# Patient Record
Sex: Female | Born: 1970
Health system: Southern US, Community
[De-identification: ages and names within clinical notes are randomized; demographics above are authoritative.]

## PROBLEM LIST (undated history)

## (undated) DIAGNOSIS — R519 Headache, unspecified: Secondary | ICD-10-CM

## (undated) DIAGNOSIS — M1990 Unspecified osteoarthritis, unspecified site: Secondary | ICD-10-CM

## (undated) DIAGNOSIS — E039 Hypothyroidism, unspecified: Secondary | ICD-10-CM

## (undated) DIAGNOSIS — T8859XA Other complications of anesthesia, initial encounter: Secondary | ICD-10-CM

## (undated) DIAGNOSIS — I10 Essential (primary) hypertension: Secondary | ICD-10-CM

## (undated) DIAGNOSIS — T4145XA Adverse effect of unspecified anesthetic, initial encounter: Secondary | ICD-10-CM

## (undated) HISTORY — PX: KNEE ARTHROSCOPY: SUR90

## (undated) HISTORY — DX: Essential (primary) hypertension: I10

## (undated) HISTORY — DX: Unspecified osteoarthritis, unspecified site: M19.90

## (undated) HISTORY — PX: TONSILECTOMY, ADENOIDECTOMY, BILATERAL MYRINGOTOMY AND TUBES: SHX2538

---

## 1999-08-03 ENCOUNTER — Other Ambulatory Visit: Admission: RE | Admit: 1999-08-03 | Discharge: 1999-08-03 | Payer: Self-pay | Admitting: Obstetrics and Gynecology

## 2000-08-14 ENCOUNTER — Other Ambulatory Visit: Admission: RE | Admit: 2000-08-14 | Discharge: 2000-08-14 | Payer: Self-pay | Admitting: Obstetrics and Gynecology

## 2001-09-30 ENCOUNTER — Other Ambulatory Visit: Admission: RE | Admit: 2001-09-30 | Discharge: 2001-09-30 | Payer: Self-pay | Admitting: Obstetrics and Gynecology

## 2002-11-27 ENCOUNTER — Other Ambulatory Visit: Admission: RE | Admit: 2002-11-27 | Discharge: 2002-11-27 | Payer: Self-pay | Admitting: Obstetrics and Gynecology

## 2004-06-29 ENCOUNTER — Encounter (INDEPENDENT_AMBULATORY_CARE_PROVIDER_SITE_OTHER): Payer: Self-pay | Admitting: *Deleted

## 2004-06-29 ENCOUNTER — Ambulatory Visit (HOSPITAL_COMMUNITY): Admission: RE | Admit: 2004-06-29 | Discharge: 2004-06-30 | Payer: Self-pay | Admitting: Otolaryngology

## 2004-09-12 ENCOUNTER — Other Ambulatory Visit: Admission: RE | Admit: 2004-09-12 | Discharge: 2004-09-12 | Payer: Self-pay | Admitting: Obstetrics and Gynecology

## 2007-09-25 ENCOUNTER — Encounter: Admission: RE | Admit: 2007-09-25 | Discharge: 2007-09-25 | Payer: Self-pay | Admitting: Obstetrics & Gynecology

## 2007-12-22 ENCOUNTER — Inpatient Hospital Stay (HOSPITAL_COMMUNITY): Admission: RE | Admit: 2007-12-22 | Discharge: 2007-12-26 | Payer: Self-pay | Admitting: Obstetrics & Gynecology

## 2007-12-27 ENCOUNTER — Encounter: Admission: RE | Admit: 2007-12-27 | Discharge: 2008-01-22 | Payer: Self-pay | Admitting: Obstetrics & Gynecology

## 2008-03-08 ENCOUNTER — Ambulatory Visit (HOSPITAL_COMMUNITY): Admission: RE | Admit: 2008-03-08 | Discharge: 2008-03-08 | Payer: Self-pay | Admitting: Family Medicine

## 2008-04-06 ENCOUNTER — Other Ambulatory Visit: Admission: RE | Admit: 2008-04-06 | Discharge: 2008-04-06 | Payer: Self-pay | Admitting: Family Medicine

## 2008-04-06 ENCOUNTER — Encounter (INDEPENDENT_AMBULATORY_CARE_PROVIDER_SITE_OTHER): Payer: Self-pay | Admitting: Family Medicine

## 2011-03-20 NOTE — Discharge Summary (Signed)
Mariah Baker, Mariah Baker               ACCOUNT NO.:  [id number]   MEDICAL RECORD NO.:  [id number]          PATIENT TYPE:  INP   LOCATION:  9118                          FACILITY:  [facility]   PHYSICIAN:  Genia Del, M.D.DATE OF BIRTH:  06-04-1971   DATE OF ADMISSION:  12/22/2007  DATE OF DISCHARGE:  12/26/2007                               DISCHARGE SUMMARY   ADMISSION DIAGNOSES:  A 38 plus weeks' gestational diabetes mellitus A2,  transverse presentation, successful external cephalic version,  induction, failure to progress in active labor.   DISCHARGE DIAGNOSES:  A 38 plus weeks' gestational diabetes mellitus A2,  transverse presentation, successful external cephalic version,  induction, failure to progress in active labor, C-section delivery of a  healthy baby girl.   PROCEDURE:  Urgent low-transverse primary C-section on December 23, 2007.  No complications.   POSTOP COURSE:  The patient had an uneventful postop course.  She  remained afebrile and hemodynamically stable.  Her postop hemoglobin was  8.2 with a hematocrit at 24.3.  She was discharged home on postop day #3  in stable status.  Postop advice were given.  She will continue  metformin 750 twice a day.  She was put on Chromagen Forte 1 tablet  b.i.d.  She will follow up at [facility] in 6 weeks.      Genia Del, M.D.  Electronically Signed     ML/MEDQ  D:  01/22/2008  T:  01/23/2008  Job:  [id number]

## 2011-03-20 NOTE — Op Note (Signed)
NAMELONISHA, Mariah Baker               ACCOUNT NO.:  [id number]   MEDICAL RECORD NO.:  [id number]          PATIENT TYPE:  OBV   LOCATION:  9118                          FACILITY:  [facility]   PHYSICIAN:  Genia Del, M.D.DATE OF BIRTH:  1971-10-21   DATE OF PROCEDURE:  12/23/2007  DATE OF DISCHARGE:                               OPERATIVE REPORT   PREOPERATIVE DIAGNOSES:  1. At 38+ weeks gestation.  2. Gestational diabetes mellitus A2.  3. Failure to progress.   POSTOPERATIVE DIAGNOSES:  1. At 38+ weeks gestation.  2. Gestational diabetes mellitus A2.  3. Failure to progress.   PROCEDURE:  Urgent low transverse primary C-section.   SURGEON:  Dr. Genia Del.   ASSISTANT:  None.   PROCEDURE:  Under epidural anesthesia, the patient is in 15 degree left  decubitus position.  She is prepped with Betadine on the abdominal,  suprapubic and vulvar areas and draped as usual.  Due to obesity, the  abdomen is taped to facilitate the surgery.  We verify the level of  anesthesia and it is adequate.  We infiltrate the subcutaneous tissue  with Marcaine 0.25 plain 20 mL at the site of the Pfannenstiel incision,  which we make with the scalpel.  We then open the adipose tissue with  the electrocautery, open the aponeurosis transversely with the  electrocautery and complete on each side with the Mayo scissors.  We  complete hemostasis where needed with the electrocautery.  We then  separate the abdominal muscles from the aponeurosis on the midline  superiorly and inferiorly.  We then open the parietal peritoneum  longitudinally on the midline with Mayo scissors.  We open the visceral  peritoneum transversely over the lower uterine segment with [facility]  scissors.  The bladder is reclined downward and the bladder retractor is  put in place.  We then make a low transverse hysterotomy with the  scalpel, extension on each side with dressing scissors.  The amniotic  fluid is clear.  The fetus is  in cephalic presentation, still high in  the pelvis.  We deliver a baby girl at 4:36, loose nuchal cord is  present.  The baby is suctioned.  The cord is clamped and cut and the  baby is given to the neonatal team.  Apgars are 9 and 10.  Weight is 8  pounds 10 ounces.  We then extract the placenta manually.  It is given  for cord blood banking and then sent to labor and delivery.  We do a  uterine revision.  Pitocin is started in the IV fluid and the uterus is  contracting well.  A dose of Ancef 2 grams IV is given.  Both tubes and  both ovaries are normal to inspection.  We then close the hysterotomy  with a first locked running suture of Vicryl zero.  We then complete  with a second layer of Vicryl zero in a mattress stitch.  Hemostasis is  completed with an X stitch in the middle of the incision with Vicryl  zero.  We had exteriorized the uterus, it is put back in  place at this  point.  Hemostasis is verified once more and it is adequate.  We  irrigate and suction the abdominopelvic cavities.  We then complete  hemostasis on the recti muscles with the electrocautery.  The  aponeurosis is closed with two half running sutures of Vicryl zero.  We  put plain zero separate stitches on the adipose tissue.  Hemostasis is  completed at that level with the electrocautery.  We then reapproximate  the skin with staples.  The count of instruments and sponges was  complete x2.  Dry dressing was applied.   ESTIMATED BLOOD LOSS:  700 mL.   COMPLICATIONS:  No complications occurred and the patient was brought to  the recovery room in good stable status.      Genia Del, M.D.  Electronically Signed     ML/MEDQ  D:  12/23/2007  T:  12/23/2007  Job:  [id number]

## 2011-04-12 ENCOUNTER — Other Ambulatory Visit: Payer: Self-pay | Admitting: Obstetrics & Gynecology

## 2011-07-27 LAB — CBC
HCT: 24.3 — ABNORMAL LOW
Hemoglobin: 11.6 — ABNORMAL LOW
MCHC: 33.8
MCV: 84.6
MCV: 85.4
Platelets: 172
RDW: 17 — ABNORMAL HIGH
RDW: 17.6 — ABNORMAL HIGH
WBC: 9.1

## 2011-07-27 LAB — RPR: RPR Ser Ql: NONREACTIVE

## 2011-12-12 ENCOUNTER — Ambulatory Visit (INDEPENDENT_AMBULATORY_CARE_PROVIDER_SITE_OTHER): Payer: BC Managed Care – PPO | Admitting: Family Medicine

## 2011-12-12 VITALS — BP 145/84 | HR 71 | Temp 97.7°F | Resp 16 | Ht 64.5 in | Wt 294.4 lb

## 2011-12-12 DIAGNOSIS — I10 Essential (primary) hypertension: Secondary | ICD-10-CM | POA: Insufficient documentation

## 2011-12-12 DIAGNOSIS — E119 Type 2 diabetes mellitus without complications: Secondary | ICD-10-CM | POA: Insufficient documentation

## 2011-12-12 DIAGNOSIS — H1030 Unspecified acute conjunctivitis, unspecified eye: Secondary | ICD-10-CM

## 2011-12-12 MED ORDER — OFLOXACIN 0.3 % OP SOLN: OPHTHALMIC | Status: DC

## 2011-12-12 NOTE — Patient Instructions (Addendum)
Patient was instructed to use the eyedrops frequently for 2 days then 4 times daily. She is to return or see an eye doctor if the eye is getting worse. Marland Kitchen

## 2011-12-12 NOTE — Progress Notes (Signed)
  Subjective:    Patient ID: Mariah Baker, female    DOB: [birth year], 41 y.o.   MRN: [id number]  HPI patient has a upper chart for infection which she got from her child. Today is day progressed she has gotten more redness inflammation and drainage of the left eye. She has had conjunctivitis in the past, but not a major or recurrent problems. No history of any foreign body in her eye.    Review of Systems Upper chart her congestion is noted. She is diabetic and has high blood pressure per    Objective:   Physical Exam TMs normal except for a little dry wax throat has erythema of the soft palate. Her neck was without significant nodes sinuses not particularly tender. The left eye is quite red weepy very inflamed conjunctiva       Assessment & Plan:  Acute conjunctivitis.  Will give her antibiotic eyedrops. If she gets worse she needs to see an eye doctor.

## 2012-05-05 ENCOUNTER — Ambulatory Visit (HOSPITAL_COMMUNITY): Admission: RE | Admit: 2012-05-05 | Payer: Self-pay | Source: Ambulatory Visit

## 2012-05-05 ENCOUNTER — Other Ambulatory Visit (HOSPITAL_COMMUNITY): Payer: Self-pay | Admitting: Family Medicine

## 2012-05-05 ENCOUNTER — Ambulatory Visit (HOSPITAL_COMMUNITY): Payer: Self-pay

## 2012-05-06 ENCOUNTER — Ambulatory Visit (HOSPITAL_COMMUNITY): Payer: Self-pay

## 2012-05-06 ENCOUNTER — Ambulatory Visit (HOSPITAL_COMMUNITY)
Admission: RE | Admit: 2012-05-06 | Discharge: 2012-05-06 | Disposition: A | Discharge status: Home / Self Care | Payer: BC Managed Care – PPO | Source: Ambulatory Visit | Attending: Family Medicine | Admitting: Family Medicine

## 2012-05-06 DIAGNOSIS — M7989 Other specified soft tissue disorders: Secondary | ICD-10-CM

## 2012-05-06 NOTE — Progress Notes (Signed)
Left lower extremity venous duplex completed.  Preliminary report is negative for DVT, SVT, or a Baker's cyst in the left leg.  Negative for DVT in the right common femoral vein. 

## 2012-07-22 ENCOUNTER — Other Ambulatory Visit (HOSPITAL_COMMUNITY): Payer: Self-pay | Admitting: Family Medicine

## 2012-07-22 DIAGNOSIS — R609 Edema, unspecified: Secondary | ICD-10-CM

## 2012-07-24 ENCOUNTER — Other Ambulatory Visit (HOSPITAL_COMMUNITY): Payer: BC Managed Care – PPO

## 2012-10-03 ENCOUNTER — Ambulatory Visit (HOSPITAL_COMMUNITY)
Admission: RE | Admit: 2012-10-03 | Discharge: 2012-10-03 | Disposition: A | Discharge status: Home / Self Care | Payer: BC Managed Care – PPO | Source: Ambulatory Visit | Attending: Family Medicine | Admitting: Family Medicine

## 2012-10-03 ENCOUNTER — Other Ambulatory Visit (HOSPITAL_COMMUNITY): Payer: Self-pay | Admitting: Family Medicine

## 2012-10-03 DIAGNOSIS — E119 Type 2 diabetes mellitus without complications: Secondary | ICD-10-CM

## 2012-10-03 DIAGNOSIS — M7730 Calcaneal spur, unspecified foot: Secondary | ICD-10-CM | POA: Insufficient documentation

## 2013-03-10 ENCOUNTER — Other Ambulatory Visit: Payer: Self-pay

## 2013-03-10 DIAGNOSIS — Z1231 Encounter for screening mammogram for malignant neoplasm of breast: Secondary | ICD-10-CM

## 2013-04-14 ENCOUNTER — Ambulatory Visit
Admission: RE | Admit: 2013-04-14 | Discharge: 2013-04-14 | Disposition: A | Discharge status: Home / Self Care | Payer: BC Managed Care – PPO | Source: Ambulatory Visit

## 2013-04-14 DIAGNOSIS — Z1231 Encounter for screening mammogram for malignant neoplasm of breast: Secondary | ICD-10-CM

## 2013-04-15 ENCOUNTER — Other Ambulatory Visit: Payer: Self-pay | Admitting: Obstetrics & Gynecology

## 2013-04-15 DIAGNOSIS — R928 Other abnormal and inconclusive findings on diagnostic imaging of breast: Secondary | ICD-10-CM

## 2013-04-29 ENCOUNTER — Other Ambulatory Visit: Payer: Self-pay | Admitting: Obstetrics & Gynecology

## 2013-04-29 ENCOUNTER — Ambulatory Visit
Admission: RE | Admit: 2013-04-29 | Discharge: 2013-04-29 | Disposition: A | Discharge status: Home / Self Care | Payer: BC Managed Care – PPO | Source: Ambulatory Visit | Attending: Obstetrics & Gynecology | Admitting: Obstetrics & Gynecology

## 2013-04-29 DIAGNOSIS — R928 Other abnormal and inconclusive findings on diagnostic imaging of breast: Secondary | ICD-10-CM

## 2013-05-06 ENCOUNTER — Other Ambulatory Visit: Payer: Self-pay | Admitting: Obstetrics & Gynecology

## 2013-05-06 ENCOUNTER — Ambulatory Visit
Admission: RE | Admit: 2013-05-06 | Discharge: 2013-05-06 | Disposition: A | Discharge status: Home / Self Care | Payer: BC Managed Care – PPO | Source: Ambulatory Visit | Attending: Obstetrics & Gynecology | Admitting: Obstetrics & Gynecology

## 2013-05-06 DIAGNOSIS — R928 Other abnormal and inconclusive findings on diagnostic imaging of breast: Secondary | ICD-10-CM

## 2013-09-21 ENCOUNTER — Other Ambulatory Visit: Payer: Self-pay | Admitting: Obstetrics & Gynecology

## 2013-09-21 DIAGNOSIS — N6310 Unspecified lump in the right breast, unspecified quadrant: Secondary | ICD-10-CM

## 2013-10-21 ENCOUNTER — Ambulatory Visit
Admission: RE | Admit: 2013-10-21 | Discharge: 2013-10-21 | Disposition: A | Discharge status: Home / Self Care | Payer: BC Managed Care – PPO | Source: Ambulatory Visit | Attending: Obstetrics & Gynecology | Admitting: Obstetrics & Gynecology

## 2013-10-21 DIAGNOSIS — N6310 Unspecified lump in the right breast, unspecified quadrant: Secondary | ICD-10-CM

## 2014-03-24 ENCOUNTER — Other Ambulatory Visit: Payer: Self-pay

## 2014-03-24 DIAGNOSIS — Z1231 Encounter for screening mammogram for malignant neoplasm of breast: Secondary | ICD-10-CM

## 2014-05-04 ENCOUNTER — Ambulatory Visit
Admission: RE | Admit: 2014-05-04 | Discharge: 2014-05-04 | Disposition: A | Discharge status: Home / Self Care | Payer: BC Managed Care – PPO | Source: Ambulatory Visit

## 2014-05-04 DIAGNOSIS — Z1231 Encounter for screening mammogram for malignant neoplasm of breast: Secondary | ICD-10-CM

## 2014-05-17 ENCOUNTER — Other Ambulatory Visit: Payer: Self-pay | Admitting: Specialist

## 2014-05-17 DIAGNOSIS — G932 Benign intracranial hypertension: Secondary | ICD-10-CM

## 2014-06-04 ENCOUNTER — Ambulatory Visit
Admission: RE | Admit: 2014-06-04 | Discharge: 2014-06-04 | Disposition: A | Discharge status: Home / Self Care | Payer: BC Managed Care – PPO | Source: Ambulatory Visit | Attending: Specialist | Admitting: Specialist

## 2014-06-04 VITALS — BP 94/56 | HR 67

## 2014-06-04 DIAGNOSIS — G932 Benign intracranial hypertension: Secondary | ICD-10-CM

## 2014-06-04 NOTE — Progress Notes (Signed)
Discharge instructions explained to patient, along with the "gray area" of performing an epidural blood patch on a patient with suspected pseudotumor cerebri.  Patient understands she may have a positional headache from the LP just because we altered her ICP from an opening pressure of 39 cm H2O to 16 cm H2O at closing and that her brain may take some time to adjust to its new "normal."  Mariah SievertJeanne Providencia Hottenstein, RN

## 2014-06-04 NOTE — Progress Notes (Signed)
One tiger-topped tube of blood drawn from right AC space without difficulty for LP labs; site unremarkable.  jkl 

## 2014-06-04 NOTE — Discharge Instructions (Signed)

## 2014-06-05 LAB — HERPES SIMPLEX VIRUS(HSV) DNA BY PCR
HSV 1 DNA: NOT DETECTED
HSV 2 DNA: NOT DETECTED

## 2014-06-05 LAB — CRYPTOCOCCAL ANTIGEN, CSF: CRYPTO AG: NEGATIVE

## 2014-06-08 LAB — CSF PANEL 1
Glucose, CSF: 59 mg/dL (ref 43–76)
RBC COUNT CSF: 30 uL — AB
SYPHILIS VDRL QUANT CSF: NONREACTIVE
TOTAL PROTEIN, CSF: 43 mg/dL (ref 15–45)
Tube #: 2
WBC CSF: 1 uL (ref 0–5)

## 2014-06-08 LAB — ANGIOTENSIN CONVERTING ENZYME, CSF: ACE, CSF: 9 U/L (ref <=15)

## 2014-06-21 LAB — B. BURGDORFI ANTIBODIES, CSF: LYME AB: NEGATIVE

## 2014-07-01 LAB — FUNGUS CULTURE W SMEAR: SMEAR RESULT: NONE SEEN

## 2014-08-20 ENCOUNTER — Other Ambulatory Visit: Payer: Self-pay

## 2014-08-27 ENCOUNTER — Ambulatory Visit (HOSPITAL_BASED_OUTPATIENT_CLINIC_OR_DEPARTMENT_OTHER): Payer: BC Managed Care – PPO | Attending: Specialist | Admitting: Radiology

## 2014-08-27 DIAGNOSIS — Z6841 Body Mass Index (BMI) 40.0 and over, adult: Secondary | ICD-10-CM | POA: Diagnosis not present

## 2014-08-27 DIAGNOSIS — G4730 Sleep apnea, unspecified: Secondary | ICD-10-CM | POA: Diagnosis present

## 2014-08-27 DIAGNOSIS — G4733 Obstructive sleep apnea (adult) (pediatric): Secondary | ICD-10-CM | POA: Insufficient documentation

## 2014-08-27 DIAGNOSIS — G4710 Hypersomnia, unspecified: Secondary | ICD-10-CM | POA: Diagnosis present

## 2014-08-27 DIAGNOSIS — R0683 Snoring: Secondary | ICD-10-CM

## 2014-08-29 DIAGNOSIS — R0683 Snoring: Secondary | ICD-10-CM

## 2014-08-29 NOTE — Sleep Study (Signed)
   NAME: Mariah Baker DATE OF BIRTH:  1971-07-27 MEDICAL RECORD NUMBER [id number]  LOCATION: [facility]  PHYSICIAN: Rainbow Salman D  DATE OF STUDY: 08/27/2014  SLEEP STUDY TYPE: Nocturnal Polysomnogram               REFERRING PHYSICIAN: Santiago GladFreeman, Marshall, MD  INDICATION FOR STUDY: Hypersomnia with sleep apnea  EPWORTH SLEEPINESS SCORE:  2/24 HEIGHT:   5'4"  WEIGHT:   263 lbs    BMI: 45 NECK SIZE: 15.5 in.  MEDICATIONS: Charted for review  SLEEP ARCHITECTURE: Total sleep time 390 min, Sleep efficiency 89.4%, Stage N1 11.5%, Stage N2 80.8%, Stage N3 absent, REM 7.7%, Sleep latency 5.5 min, REM latency 255.5 min, Awake after sleep onset 40.5 min, arousal index 57.7, bedtime medication None  RESPIRATORY DATA: Apnea/ Hypopnea Index (AHI) 81.5/ hr. 529 total events scored, including 248 obstructive apneas, 3 central apneas, 1 mixed apnea, 277 hypopneas. Events were not positional. REM AHI 24.0/ hr. This study was ordered as a diagnostic polysomnogram (NPSG) without CPAP.  OXYGEN DATA: Very loud snoring, with oxygen desaturation to a nadir of 76% and mean oxygen saturation of 93% on room air  CARDIAC DATA: Normal sinus rhythm  MOVEMENT/PARASOMNIA: No significant movement disturbance, no bathroom trips  IMPRESSION/ RECOMMENDATION:   1) Severe obstructive sleep apnea/ hypopnea syndrome, AHI 81.5/ hr. Nonpositional. REM AHI 24.0/ hr. Very loud snoring with oxygen desaturation to a nadir of 76% and mean oxygen saturation 93% on room air.  This study was ordered as a diagnostic NPSG without CPAP titration. The patient can return for a CPAP titration study if appropriate, and consultation for management is available if desired.   Waymon BudgeYOUNG,Javid Kemler D Diplomate, American Board of Sleep Medicine  ELECTRONICALLY SIGNED ON:  08/29/2014, 1:20 PM  [facility] PH: [phone]   FX: [phone] ACCREDITED BY THE AMERICAN ACADEMY OF SLEEP  MEDICINE

## 2015-05-02 ENCOUNTER — Other Ambulatory Visit: Payer: Self-pay

## 2015-05-31 ENCOUNTER — Other Ambulatory Visit: Payer: Self-pay

## 2015-05-31 DIAGNOSIS — Z1231 Encounter for screening mammogram for malignant neoplasm of breast: Secondary | ICD-10-CM

## 2015-07-07 ENCOUNTER — Ambulatory Visit
Admission: RE | Admit: 2015-07-07 | Discharge: 2015-07-07 | Disposition: A | Discharge status: Home / Self Care | Payer: BLUE CROSS/BLUE SHIELD | Source: Ambulatory Visit

## 2015-07-07 DIAGNOSIS — Z1231 Encounter for screening mammogram for malignant neoplasm of breast: Secondary | ICD-10-CM

## 2016-03-13 DIAGNOSIS — Z0000 Encounter for general adult medical examination without abnormal findings: Secondary | ICD-10-CM | POA: Diagnosis not present

## 2016-03-13 DIAGNOSIS — Z1389 Encounter for screening for other disorder: Secondary | ICD-10-CM | POA: Diagnosis not present

## 2016-03-13 DIAGNOSIS — Z6841 Body Mass Index (BMI) 40.0 and over, adult: Secondary | ICD-10-CM | POA: Diagnosis not present

## 2016-03-13 DIAGNOSIS — E119 Type 2 diabetes mellitus without complications: Secondary | ICD-10-CM | POA: Diagnosis not present

## 2016-06-08 DIAGNOSIS — G932 Benign intracranial hypertension: Secondary | ICD-10-CM | POA: Diagnosis not present

## 2016-06-08 DIAGNOSIS — R51 Headache: Secondary | ICD-10-CM | POA: Diagnosis not present

## 2016-07-19 ENCOUNTER — Other Ambulatory Visit: Payer: Self-pay | Admitting: Obstetrics & Gynecology

## 2016-07-19 DIAGNOSIS — Z1231 Encounter for screening mammogram for malignant neoplasm of breast: Secondary | ICD-10-CM

## 2016-08-06 ENCOUNTER — Ambulatory Visit
Admission: RE | Admit: 2016-08-06 | Discharge: 2016-08-06 | Disposition: A | Discharge status: Home / Self Care | Payer: BLUE CROSS/BLUE SHIELD | Source: Ambulatory Visit | Attending: Obstetrics & Gynecology | Admitting: Obstetrics & Gynecology

## 2016-08-06 DIAGNOSIS — Z1231 Encounter for screening mammogram for malignant neoplasm of breast: Secondary | ICD-10-CM

## 2016-09-05 DIAGNOSIS — Z6841 Body Mass Index (BMI) 40.0 and over, adult: Secondary | ICD-10-CM | POA: Diagnosis not present

## 2016-09-05 DIAGNOSIS — Z01419 Encounter for gynecological examination (general) (routine) without abnormal findings: Secondary | ICD-10-CM | POA: Diagnosis not present

## 2016-11-26 DIAGNOSIS — R946 Abnormal results of thyroid function studies: Secondary | ICD-10-CM | POA: Diagnosis not present

## 2016-11-26 DIAGNOSIS — E782 Mixed hyperlipidemia: Secondary | ICD-10-CM | POA: Diagnosis not present

## 2016-11-26 DIAGNOSIS — Z23 Encounter for immunization: Secondary | ICD-10-CM | POA: Diagnosis not present

## 2016-11-26 DIAGNOSIS — Z6841 Body Mass Index (BMI) 40.0 and over, adult: Secondary | ICD-10-CM | POA: Diagnosis not present

## 2016-11-26 DIAGNOSIS — E119 Type 2 diabetes mellitus without complications: Secondary | ICD-10-CM | POA: Diagnosis not present

## 2016-11-26 DIAGNOSIS — Z1389 Encounter for screening for other disorder: Secondary | ICD-10-CM | POA: Diagnosis not present

## 2016-12-11 DIAGNOSIS — E119 Type 2 diabetes mellitus without complications: Secondary | ICD-10-CM | POA: Diagnosis not present

## 2016-12-11 DIAGNOSIS — H5213 Myopia, bilateral: Secondary | ICD-10-CM | POA: Diagnosis not present

## 2016-12-11 DIAGNOSIS — H524 Presbyopia: Secondary | ICD-10-CM | POA: Diagnosis not present

## 2016-12-11 DIAGNOSIS — H52203 Unspecified astigmatism, bilateral: Secondary | ICD-10-CM | POA: Diagnosis not present

## 2017-01-08 DIAGNOSIS — E039 Hypothyroidism, unspecified: Secondary | ICD-10-CM | POA: Diagnosis not present

## 2017-01-08 DIAGNOSIS — Z1389 Encounter for screening for other disorder: Secondary | ICD-10-CM | POA: Diagnosis not present

## 2017-01-08 DIAGNOSIS — Z6841 Body Mass Index (BMI) 40.0 and over, adult: Secondary | ICD-10-CM | POA: Diagnosis not present

## 2017-03-15 DIAGNOSIS — E782 Mixed hyperlipidemia: Secondary | ICD-10-CM | POA: Diagnosis not present

## 2017-03-15 DIAGNOSIS — Z1389 Encounter for screening for other disorder: Secondary | ICD-10-CM | POA: Diagnosis not present

## 2017-03-15 DIAGNOSIS — E039 Hypothyroidism, unspecified: Secondary | ICD-10-CM | POA: Diagnosis not present

## 2017-03-15 DIAGNOSIS — Z6841 Body Mass Index (BMI) 40.0 and over, adult: Secondary | ICD-10-CM | POA: Diagnosis not present

## 2017-03-15 DIAGNOSIS — E119 Type 2 diabetes mellitus without complications: Secondary | ICD-10-CM | POA: Diagnosis not present

## 2017-08-28 ENCOUNTER — Other Ambulatory Visit: Payer: Self-pay | Admitting: Obstetrics & Gynecology

## 2017-08-28 DIAGNOSIS — Z1231 Encounter for screening mammogram for malignant neoplasm of breast: Secondary | ICD-10-CM

## 2017-09-19 ENCOUNTER — Telehealth: Payer: Self-pay | Admitting: *Deleted

## 2017-09-19 ENCOUNTER — Ambulatory Visit
Admission: RE | Admit: 2017-09-19 | Discharge: 2017-09-19 | Disposition: A | Discharge status: Home / Self Care | Payer: BLUE CROSS/BLUE SHIELD | Source: Ambulatory Visit | Attending: Obstetrics & Gynecology | Admitting: Obstetrics & Gynecology

## 2017-09-19 DIAGNOSIS — Z1231 Encounter for screening mammogram for malignant neoplasm of breast: Secondary | ICD-10-CM | POA: Diagnosis not present

## 2017-09-19 NOTE — Telephone Encounter (Signed)
Pt called requesting birth control pill refill, paper [facility] from [facility] to send chart and Rx can be refilled.

## 2017-09-23 ENCOUNTER — Other Ambulatory Visit: Payer: Self-pay | Admitting: Obstetrics & Gynecology

## 2017-09-23 NOTE — Telephone Encounter (Signed)
Patient has annual scheduled on 11/13/17, Rx sent for Camila, chart arrived. Rx was approved in refill request.

## 2017-10-02 DIAGNOSIS — E782 Mixed hyperlipidemia: Secondary | ICD-10-CM | POA: Diagnosis not present

## 2017-10-02 DIAGNOSIS — Z6841 Body Mass Index (BMI) 40.0 and over, adult: Secondary | ICD-10-CM | POA: Diagnosis not present

## 2017-10-02 DIAGNOSIS — Z1389 Encounter for screening for other disorder: Secondary | ICD-10-CM | POA: Diagnosis not present

## 2017-10-02 DIAGNOSIS — I10 Essential (primary) hypertension: Secondary | ICD-10-CM | POA: Diagnosis not present

## 2017-10-02 DIAGNOSIS — E119 Type 2 diabetes mellitus without complications: Secondary | ICD-10-CM | POA: Diagnosis not present

## 2017-10-02 DIAGNOSIS — E039 Hypothyroidism, unspecified: Secondary | ICD-10-CM | POA: Diagnosis not present

## 2017-10-02 DIAGNOSIS — Z23 Encounter for immunization: Secondary | ICD-10-CM | POA: Diagnosis not present

## 2017-11-13 ENCOUNTER — Encounter: Payer: Self-pay | Admitting: Obstetrics & Gynecology

## 2017-11-13 ENCOUNTER — Ambulatory Visit (INDEPENDENT_AMBULATORY_CARE_PROVIDER_SITE_OTHER): Payer: BLUE CROSS/BLUE SHIELD | Admitting: Obstetrics & Gynecology

## 2017-11-13 VITALS — BP 136/84 | Ht 64.0 in | Wt 290.0 lb

## 2017-11-13 DIAGNOSIS — Z6841 Body Mass Index (BMI) 40.0 and over, adult: Secondary | ICD-10-CM

## 2017-11-13 DIAGNOSIS — Z01419 Encounter for gynecological examination (general) (routine) without abnormal findings: Secondary | ICD-10-CM

## 2017-11-13 DIAGNOSIS — Z3041 Encounter for surveillance of contraceptive pills: Secondary | ICD-10-CM

## 2017-11-13 MED ORDER — NORETHINDRONE 0.35 MG PO TABS: ORAL_TABLET | ORAL | 4 refills | Status: DC

## 2017-11-13 NOTE — Patient Instructions (Signed)
1. Encounter for routine gynecological examination with Papanicolaou smear of cervix Normal gynecologic exam.  Pap reflex done.  Breast exam normal.  Recent screening mammogram in November 2000 18 negative.  Diabetes mellitus type 2 and hypothyroidism followed by endocrinologist.  Chronic hypertension well controlled on losartan.  2. Encounter for surveillance of contraceptive pills Doing well on progestin only pill.  No contraindication.  Represcribed.  3. Class 3 severe obesity due to excess calories with serious comorbidity and body mass index (BMI) of 45.0 to 49.9 in adult [facility]) Importance of weight loss discussed.  Recommend low calorie/low carb diet such as [employer].  Regular physical activity at least 5 times a week of aerobic and weightlifting every 2 days recommended.  Other orders - norethindrone (MICRONOR,CAMILA,ERRIN) 0.35 MG tablet; TAKE ONE (1) TABLET BY MOUTH EVERY DAY   Mariah Baker, it was a pleasure seeing you today!  I will inform you of your results as soon as they are available.   Exercising to Lose Weight Exercising can help you to lose weight. In order to lose weight through exercise, you need to do vigorous-intensity exercise. You can tell that you are exercising with vigorous intensity if you are breathing very hard and fast and cannot hold a conversation while exercising. Moderate-intensity exercise helps to maintain your current weight. You can tell that you are exercising at a moderate level if you have a higher heart rate and faster breathing, but you are still able to hold a conversation. How often should I exercise? Choose an activity that you enjoy and set realistic goals. Your health care provider can help you to make an activity plan that works for you. Exercise regularly as directed by your health care provider. This may include:  Doing resistance training twice each week, such as: ? Push-ups. ? Sit-ups. ? Lifting weights. ? Using resistance  bands.  Doing a given intensity of exercise for a given amount of time. Choose from these options: ? 150 minutes of moderate-intensity exercise every week. ? 75 minutes of vigorous-intensity exercise every week. ? A mix of moderate-intensity and vigorous-intensity exercise every week.  Children, pregnant women, people who are out of shape, people who are overweight, and older adults may need to consult a health care provider for individual recommendations. If you have any sort of medical condition, be sure to consult your health care provider before starting a new exercise program. What are some activities that can help me to lose weight?  Walking at a rate of at least 4.5 miles an hour.  Jogging or running at a rate of 5 miles per hour.  Biking at a rate of at least 10 miles per hour.  Lap swimming.  Roller-skating or in-line skating.  Cross-country skiing.  Vigorous competitive sports, such as football, basketball, and soccer.  Jumping rope.  Aerobic dancing. How can I be more active in my day-to-day activities?  Use the stairs instead of the elevator.  Take a walk during your lunch break.  If you drive, park your car farther away from work or school.  If you take public transportation, get off one stop early and walk the rest of the way.  Make all of your phone calls while standing up and walking around.  Get up, stretch, and walk around every 30 minutes throughout the day. What guidelines should I follow while exercising?  Do not exercise so much that you hurt yourself, feel dizzy, or get very short of breath.  Consult your health care  provider prior to starting a new exercise program.  Wear comfortable clothes and shoes with good support.  Drink plenty of water while you exercise to prevent dehydration or heat stroke. Body water is lost during exercise and must be replaced.  Work out until you breathe faster and your heart beats faster. This information is not  intended to replace advice given to you by your health care provider. Make sure you discuss any questions you have with your health care provider. Document Released: 11/24/2010 Document Revised: 03/29/2016 Document Reviewed: 03/25/2014 Elsevier Interactive Patient Education  2018 [employer].  Calorie Counting for [employer] Loss Calories are units of energy. Your body needs a certain amount of calories from food to keep you going throughout the day. When you eat more calories than your body needs, your body stores the extra calories as fat. When you eat fewer calories than your body needs, your body burns fat to get the energy it needs. Calorie counting means keeping track of how many calories you eat and drink each day. Calorie counting can be helpful if you need to lose weight. If you make sure to eat fewer calories than your body needs, you should lose weight. Ask your health care provider what a healthy weight is for you. For calorie counting to work, you will need to eat the right number of calories in a day in order to lose a healthy amount of weight per week. A dietitian can help you determine how many calories you need in a day and will give you suggestions on how to reach your calorie goal.  A healthy amount of weight to lose per week is usually 1-2 lb (0.5-0.9 kg). This usually means that your daily calorie intake should be reduced by 500-750 calories.  Eating 1,200 - 1,500 calories per day can help most women lose weight.  Eating 1,500 - 1,800 calories per day can help most men lose weight.  What is my plan? My goal is to have __________ calories per day. If I have this many calories per day, I should lose around __________ pounds per week. What do I need to know about calorie counting? In order to meet your daily calorie goal, you will need to:  Find out how many calories are in each food you would like to eat. Try to do this before you eat.  Decide how much of the food you plan to  eat.  Write down what you ate and how many calories it had. Doing this is called keeping a food log.  To successfully lose weight, it is important to balance calorie counting with a healthy lifestyle that includes regular activity. Aim for 150 minutes of moderate exercise (such as walking) or 75 minutes of vigorous exercise (such as running) each week. Where do I find calorie information?  The number of calories in a food can be found on a Nutrition Facts label. If a food does not have a Nutrition Facts label, try to look up the calories online or ask your dietitian for help. Remember that calories are listed per serving. If you choose to have more than one serving of a food, you will have to multiply the calories per serving by the amount of servings you plan to eat. For example, the label on a package of bread might say that a serving size is 1 slice and that there are 90 calories in a serving. If you eat 1 slice, you will have eaten 90 calories. If you eat  2 slices, you will have eaten 180 calories. How do I keep a food log? Immediately after each meal, record the following information in your food log:  What you ate. Don't forget to include toppings, sauces, and other extras on the food.  How much you ate. This can be measured in cups, ounces, or number of items.  How many calories each food and drink had.  The total number of calories in the meal.  Keep your food log near you, such as in a small notebook in your pocket, or use a mobile app or website. Some programs will calculate calories for you and show you how many calories you have left for the day to meet your goal. What are some calorie counting tips?  Use your calories on foods and drinks that will fill you up and not leave you hungry: ? Some examples of foods that fill you up are nuts and nut butters, vegetables, lean proteins, and high-fiber foods like whole grains. High-fiber foods are foods with more than 5 g fiber per  serving. ? Drinks such as sodas, specialty coffee drinks, alcohol, and juices have a lot of calories, yet do not fill you up.  Eat nutritious foods and avoid empty calories. Empty calories are calories you get from foods or beverages that do not have many vitamins or protein, such as candy, sweets, and soda. It is better to have a nutritious high-calorie food (such as an avocado) than a food with few nutrients (such as a bag of chips).  Know how many calories are in the foods you eat most often. This will help you calculate calorie counts faster.  Pay attention to calories in drinks. Low-calorie drinks include water and unsweetened drinks.  Pay attention to nutrition labels for "low fat" or "fat free" foods. These foods sometimes have the same amount of calories or more calories than the full fat versions. They also often have added sugar, starch, or salt, to make up for flavor that was removed with the fat.  Find a way of tracking calories that works for you. Get creative. Try different apps or programs if writing down calories does not work for you. What are some portion control tips?  Know how many calories are in a serving. This will help you know how many servings of a certain food you can have.  Use a measuring cup to measure serving sizes. You could also try weighing out portions on a kitchen scale. With time, you will be able to estimate serving sizes for some foods.  Take some time to put servings of different foods on your favorite plates, bowls, and cups so you know what a serving looks like.  Try not to eat straight from a bag or box. Doing this can lead to overeating. Put the amount you would like to eat in a cup or on a plate to make sure you are eating the right portion.  Use smaller plates, glasses, and bowls to prevent overeating.  Try not to multitask (for example, watch TV or use your computer) while eating. If it is time to eat, sit down at a table and enjoy your food.  This will help you to know when you are full. It will also help you to be aware of what you are eating and how much you are eating. What are tips for following this plan? Reading food labels  Check the calorie count compared to the serving size. The serving size may be smaller than  what you are used to eating.  Check the source of the calories. Make sure the food you are eating is high in vitamins and protein and low in saturated and trans fats. Shopping  Read nutrition labels while you shop. This will help you make healthy decisions before you decide to purchase your food.  Make a grocery list and stick to it. Cooking  Try to cook your favorite foods in a healthier way. For example, try baking instead of frying.  Use low-fat dairy products. Meal planning  Use more fruits and vegetables. Half of your plate should be fruits and vegetables.  Include lean proteins like poultry and fish. How do I count calories when eating out?  Ask for smaller portion sizes.  Consider sharing an entree and sides instead of getting your own entree.  If you get your own entree, eat only half. Ask for a box at the beginning of your meal and put the rest of your entree in it so you are not tempted to eat it.  If calories are listed on the menu, choose the lower calorie options.  Choose dishes that include vegetables, fruits, whole grains, low-fat dairy products, and lean protein.  Choose items that are boiled, broiled, grilled, or steamed. Stay away from items that are buttered, battered, fried, or served with cream sauce. Items labeled "crispy" are usually fried, unless stated otherwise.  Choose water, low-fat milk, unsweetened iced tea, or other drinks without added sugar. If you want an alcoholic beverage, choose a lower calorie option such as a glass of wine or light beer.  Ask for dressings, sauces, and syrups on the side. These are usually high in calories, so you should limit the amount you  eat.  If you want a salad, choose a garden salad and ask for grilled meats. Avoid extra toppings like bacon, cheese, or fried items. Ask for the dressing on the side, or ask for olive oil and vinegar or lemon to use as dressing.  Estimate how many servings of a food you are given. For example, a serving of cooked rice is  cup or about the size of half a baseball. Knowing serving sizes will help you be aware of how much food you are eating at restaurants. The list below tells you how big or small some common portion sizes are based on everyday objects: ? 1 oz-4 stacked dice. ? 3 oz-1 deck of cards. ? 1 tsp-1 die. ? 1 Tbsp- a ping-pong ball. ? 2 Tbsp-1 ping-pong ball. ?  cup- baseball. ? 1 cup-1 baseball. Summary  Calorie counting means keeping track of how many calories you eat and drink each day. If you eat fewer calories than your body needs, you should lose weight.  A healthy amount of weight to lose per week is usually 1-2 lb (0.5-0.9 kg). This usually means reducing your daily calorie intake by 500-750 calories.  The number of calories in a food can be found on a Nutrition Facts label. If a food does not have a Nutrition Facts label, try to look up the calories online or ask your dietitian for help.  Use your calories on foods and drinks that will fill you up, and not on foods and drinks that will leave you hungry.  Use smaller plates, glasses, and bowls to prevent overeating. This information is not intended to replace advice given to you by your health care provider. Make sure you discuss any questions you have with your health care provider. Document Released:  10/22/2005 Document Revised: 09/21/2016 Document Reviewed: 09/21/2016 Elsevier Interactive Patient Education  [employer].

## 2017-11-13 NOTE — Progress Notes (Signed)
[facility] August 08, 1971 [id number]   History:    47 y.o.  G1P2 Married.  Daughter is 10 yo, enjoys softball  RP:  Established patient presenting for annual gyn exam   HPI: Well on the progestin only pill.  No abnormal bleeding.  No pelvic pain.  Normal secretions.  Breasts normal.  Urine and bowel movements normal.  Body mass index 49.78.  Patient started back on regular physical activity in December 2018.  Diabetes mellitus type 2 well controlled on metformin and chronic hypertension well controlled on losartan.  Hypothyroidism on levothyroxine.  Past medical history,surgical history, family history and social history were all reviewed and documented in the EPIC chart.  Gynecologic History No LMP recorded. Patient is not currently having periods (Reason: Oral contraceptives). Contraception: oral progesterone-only contraceptive Last Pap: 2016 or 2017. Results were: normal Last mammogram: 09/2017. Results were: Negative  Obstetric History OB History  Gravida Para Term Preterm AB Living  1 1       1   SAB TAB Ectopic Multiple Live Births               # Outcome Date GA Lbr Len/2nd Weight Sex Delivery Anes PTL Lv  1 Para                ROS: A ROS was performed and pertinent positives and negatives are included in the history.  GENERAL: No fevers or chills. HEENT: No change in vision, no earache, sore throat or sinus congestion. NECK: No pain or stiffness. CARDIOVASCULAR: No chest pain or pressure. No palpitations. PULMONARY: No shortness of breath, cough or wheeze. GASTROINTESTINAL: No abdominal pain, nausea, vomiting or diarrhea, melena or bright red blood per rectum. GENITOURINARY: No urinary frequency, urgency, hesitancy or dysuria. MUSCULOSKELETAL: No joint or muscle pain, no back pain, no recent trauma. DERMATOLOGIC: No rash, no itching, no lesions. ENDOCRINE: No polyuria, polydipsia, no heat or cold intolerance. No recent change in weight. HEMATOLOGICAL: No anemia or easy  bruising or bleeding. NEUROLOGIC: No headache, seizures, numbness, tingling or weakness. PSYCHIATRIC: No depression, no loss of interest in normal activity or change in sleep pattern.     Exam:   BP 136/84 (Cuff Size: Large)   Ht 5\' 4"  (1.626 m)   Wt 290 lb (131.5 kg)   BMI 49.78 kg/m   Body mass index is 49.78 kg/m.  General appearance : Well developed well nourished female. No acute distress HEENT: Eyes: no retinal hemorrhage or exudates,  Neck supple, trachea midline, no carotid bruits, no thyroidmegaly Lungs: Clear to auscultation, no rhonchi or wheezes, or rib retractions  Heart: Regular rate and rhythm, no murmurs or gallops Breast:Examined in sitting and supine position were symmetrical in appearance, no palpable masses or tenderness,  no skin retraction, no nipple inversion, no nipple discharge, no skin discoloration, no axillary or supraclavicular lymphadenopathy Abdomen: no palpable masses or tenderness, no rebound or guarding Extremities: no edema or skin discoloration or tenderness  Pelvic: Vulva normal  Bartholin, Urethra, Skene Glands: Within normal limits             Vagina: No gross lesions or discharge  Cervix: No gross lesions or discharge.  Pap reflex done.  Uterus  AV, normal size, shape and consistency, non-tender and mobile  Adnexa  Without masses or tenderness  Anus and perineum  normal     Assessment/Plan:  47 y.o. female for annual exam   1. Encounter for routine gynecological examination with Papanicolaou smear of cervix Normal gynecologic  exam.  Pap reflex done.  Breast exam normal.  Recent screening mammogram in November 2000 18 negative.  Diabetes mellitus type 2 and hypothyroidism followed by endocrinologist.  Chronic hypertension well controlled on losartan.  2. Encounter for surveillance of contraceptive pills Doing well on progestin only pill.  No contraindication.  Represcribed.  3. Class 3 severe obesity due to excess calories with serious  comorbidity and body mass index (BMI) of 45.0 to 49.9 in adult [facility](HCC) Importance of weight loss discussed.  Recommend low calorie/low carb diet such as [employer] diet.  Regular physical activity at least 5 times a week of aerobic and weightlifting every 2 days recommended.  Other orders - norethindrone (MICRONOR,CAMILA,ERRIN) 0.35 MG tablet; TAKE ONE (1) TABLET BY MOUTH EVERY DAY  Genia DelMarie-Lyne Donnel Venuto MD, 8:20 AM 11/13/2017

## 2017-11-14 LAB — PAP IG W/ RFLX HPV ASCU

## 2017-12-10 DIAGNOSIS — H52203 Unspecified astigmatism, bilateral: Secondary | ICD-10-CM | POA: Diagnosis not present

## 2017-12-10 DIAGNOSIS — H5213 Myopia, bilateral: Secondary | ICD-10-CM | POA: Diagnosis not present

## 2017-12-10 DIAGNOSIS — H524 Presbyopia: Secondary | ICD-10-CM | POA: Diagnosis not present

## 2017-12-10 DIAGNOSIS — E119 Type 2 diabetes mellitus without complications: Secondary | ICD-10-CM | POA: Diagnosis not present

## 2018-03-21 DIAGNOSIS — Z6841 Body Mass Index (BMI) 40.0 and over, adult: Secondary | ICD-10-CM | POA: Diagnosis not present

## 2018-03-21 DIAGNOSIS — M25512 Pain in left shoulder: Secondary | ICD-10-CM | POA: Diagnosis not present

## 2018-03-21 DIAGNOSIS — E039 Hypothyroidism, unspecified: Secondary | ICD-10-CM | POA: Diagnosis not present

## 2018-03-21 DIAGNOSIS — E119 Type 2 diabetes mellitus without complications: Secondary | ICD-10-CM | POA: Diagnosis not present

## 2018-03-21 DIAGNOSIS — E785 Hyperlipidemia, unspecified: Secondary | ICD-10-CM | POA: Diagnosis not present

## 2018-03-21 DIAGNOSIS — Z1389 Encounter for screening for other disorder: Secondary | ICD-10-CM | POA: Diagnosis not present

## 2018-03-21 DIAGNOSIS — I10 Essential (primary) hypertension: Secondary | ICD-10-CM | POA: Diagnosis not present

## 2018-05-30 DIAGNOSIS — M25512 Pain in left shoulder: Secondary | ICD-10-CM | POA: Diagnosis not present

## 2018-06-11 DIAGNOSIS — M6281 Muscle weakness (generalized): Secondary | ICD-10-CM | POA: Diagnosis not present

## 2018-06-11 DIAGNOSIS — M25612 Stiffness of left shoulder, not elsewhere classified: Secondary | ICD-10-CM | POA: Diagnosis not present

## 2018-06-11 DIAGNOSIS — S43422D Sprain of left rotator cuff capsule, subsequent encounter: Secondary | ICD-10-CM | POA: Diagnosis not present

## 2018-06-18 DIAGNOSIS — M25612 Stiffness of left shoulder, not elsewhere classified: Secondary | ICD-10-CM | POA: Diagnosis not present

## 2018-06-18 DIAGNOSIS — S43422D Sprain of left rotator cuff capsule, subsequent encounter: Secondary | ICD-10-CM | POA: Diagnosis not present

## 2018-06-18 DIAGNOSIS — M6281 Muscle weakness (generalized): Secondary | ICD-10-CM | POA: Diagnosis not present

## 2018-06-20 DIAGNOSIS — M6281 Muscle weakness (generalized): Secondary | ICD-10-CM | POA: Diagnosis not present

## 2018-06-20 DIAGNOSIS — M25612 Stiffness of left shoulder, not elsewhere classified: Secondary | ICD-10-CM | POA: Diagnosis not present

## 2018-06-20 DIAGNOSIS — S43422D Sprain of left rotator cuff capsule, subsequent encounter: Secondary | ICD-10-CM | POA: Diagnosis not present

## 2018-06-25 DIAGNOSIS — S43422D Sprain of left rotator cuff capsule, subsequent encounter: Secondary | ICD-10-CM | POA: Diagnosis not present

## 2018-06-25 DIAGNOSIS — M6281 Muscle weakness (generalized): Secondary | ICD-10-CM | POA: Diagnosis not present

## 2018-06-25 DIAGNOSIS — M25612 Stiffness of left shoulder, not elsewhere classified: Secondary | ICD-10-CM | POA: Diagnosis not present

## 2018-06-27 DIAGNOSIS — S43422D Sprain of left rotator cuff capsule, subsequent encounter: Secondary | ICD-10-CM | POA: Diagnosis not present

## 2018-06-27 DIAGNOSIS — M25612 Stiffness of left shoulder, not elsewhere classified: Secondary | ICD-10-CM | POA: Diagnosis not present

## 2018-06-27 DIAGNOSIS — M6281 Muscle weakness (generalized): Secondary | ICD-10-CM | POA: Diagnosis not present

## 2018-06-30 DIAGNOSIS — M6281 Muscle weakness (generalized): Secondary | ICD-10-CM | POA: Diagnosis not present

## 2018-06-30 DIAGNOSIS — M25612 Stiffness of left shoulder, not elsewhere classified: Secondary | ICD-10-CM | POA: Diagnosis not present

## 2018-06-30 DIAGNOSIS — S43422D Sprain of left rotator cuff capsule, subsequent encounter: Secondary | ICD-10-CM | POA: Diagnosis not present

## 2018-07-02 DIAGNOSIS — S43422D Sprain of left rotator cuff capsule, subsequent encounter: Secondary | ICD-10-CM | POA: Diagnosis not present

## 2018-07-02 DIAGNOSIS — M6281 Muscle weakness (generalized): Secondary | ICD-10-CM | POA: Diagnosis not present

## 2018-07-02 DIAGNOSIS — M25612 Stiffness of left shoulder, not elsewhere classified: Secondary | ICD-10-CM | POA: Diagnosis not present

## 2018-07-09 DIAGNOSIS — M25612 Stiffness of left shoulder, not elsewhere classified: Secondary | ICD-10-CM | POA: Diagnosis not present

## 2018-07-09 DIAGNOSIS — S43422D Sprain of left rotator cuff capsule, subsequent encounter: Secondary | ICD-10-CM | POA: Diagnosis not present

## 2018-07-09 DIAGNOSIS — M6281 Muscle weakness (generalized): Secondary | ICD-10-CM | POA: Diagnosis not present

## 2018-07-11 DIAGNOSIS — M6281 Muscle weakness (generalized): Secondary | ICD-10-CM | POA: Diagnosis not present

## 2018-07-11 DIAGNOSIS — S43422D Sprain of left rotator cuff capsule, subsequent encounter: Secondary | ICD-10-CM | POA: Diagnosis not present

## 2018-07-11 DIAGNOSIS — M25612 Stiffness of left shoulder, not elsewhere classified: Secondary | ICD-10-CM | POA: Diagnosis not present

## 2018-07-15 ENCOUNTER — Other Ambulatory Visit: Payer: Self-pay | Admitting: Orthopaedic Surgery

## 2018-07-15 DIAGNOSIS — M25512 Pain in left shoulder: Secondary | ICD-10-CM

## 2018-07-15 DIAGNOSIS — M25612 Stiffness of left shoulder, not elsewhere classified: Secondary | ICD-10-CM | POA: Diagnosis not present

## 2018-07-20 ENCOUNTER — Ambulatory Visit
Admission: RE | Admit: 2018-07-20 | Discharge: 2018-07-20 | Disposition: A | Discharge status: Home / Self Care | Payer: BLUE CROSS/BLUE SHIELD | Source: Ambulatory Visit | Attending: Orthopaedic Surgery | Admitting: Orthopaedic Surgery

## 2018-07-20 ENCOUNTER — Inpatient Hospital Stay
Admission: RE | Admit: 2018-07-20 | Discharge: 2018-07-20 | Disposition: A | Discharge status: Home / Self Care | Payer: BLUE CROSS/BLUE SHIELD | Source: Ambulatory Visit | Attending: Orthopaedic Surgery | Admitting: Orthopaedic Surgery

## 2018-07-20 DIAGNOSIS — M75102 Unspecified rotator cuff tear or rupture of left shoulder, not specified as traumatic: Secondary | ICD-10-CM | POA: Diagnosis not present

## 2018-07-20 DIAGNOSIS — M25512 Pain in left shoulder: Secondary | ICD-10-CM

## 2018-07-25 ENCOUNTER — Other Ambulatory Visit: Payer: BLUE CROSS/BLUE SHIELD

## 2018-09-09 DIAGNOSIS — Z23 Encounter for immunization: Secondary | ICD-10-CM | POA: Diagnosis not present

## 2018-09-09 DIAGNOSIS — E785 Hyperlipidemia, unspecified: Secondary | ICD-10-CM | POA: Diagnosis not present

## 2018-09-09 DIAGNOSIS — E782 Mixed hyperlipidemia: Secondary | ICD-10-CM | POA: Diagnosis not present

## 2018-09-09 DIAGNOSIS — Z1389 Encounter for screening for other disorder: Secondary | ICD-10-CM | POA: Diagnosis not present

## 2018-09-09 DIAGNOSIS — E119 Type 2 diabetes mellitus without complications: Secondary | ICD-10-CM | POA: Diagnosis not present

## 2018-09-09 DIAGNOSIS — E039 Hypothyroidism, unspecified: Secondary | ICD-10-CM | POA: Diagnosis not present

## 2018-09-09 DIAGNOSIS — Z6841 Body Mass Index (BMI) 40.0 and over, adult: Secondary | ICD-10-CM | POA: Diagnosis not present

## 2018-09-19 ENCOUNTER — Other Ambulatory Visit: Payer: Self-pay

## 2018-09-19 ENCOUNTER — Encounter (HOSPITAL_COMMUNITY): Payer: Self-pay | Admitting: Urology

## 2018-09-19 NOTE — Progress Notes (Signed)
Spoke with patient about medical history and medications. Pt is diabetic but does not check her blood sugar at home. Pt instructed to call us if blood sugar feels low the day of surgery. Requested labwork from PCP Dr. Phillips OdorGolding.

## 2018-09-21 MED ORDER — DEXTROSE 5 % IV SOLN
3.0000 g | INTRAVENOUS | Status: AC
  Administered 2018-09-22: 3 g via INTRAVENOUS
  Filled 2018-09-21: qty 3

## 2018-09-22 ENCOUNTER — Encounter (HOSPITAL_COMMUNITY)
Admission: RE | Disposition: A | Discharge status: Home / Self Care | Payer: Self-pay | Source: Ambulatory Visit | Attending: Orthopaedic Surgery

## 2018-09-22 ENCOUNTER — Ambulatory Visit (HOSPITAL_COMMUNITY)
Admission: RE | Admit: 2018-09-22 | Discharge: 2018-09-22 | Disposition: A | Discharge status: Home / Self Care | Payer: BLUE CROSS/BLUE SHIELD | Source: Ambulatory Visit | Attending: Orthopaedic Surgery | Admitting: Orthopaedic Surgery

## 2018-09-22 ENCOUNTER — Other Ambulatory Visit: Payer: Self-pay

## 2018-09-22 ENCOUNTER — Encounter (HOSPITAL_COMMUNITY): Payer: Self-pay

## 2018-09-22 ENCOUNTER — Ambulatory Visit (HOSPITAL_COMMUNITY): Payer: BLUE CROSS/BLUE SHIELD | Admitting: Anesthesiology

## 2018-09-22 DIAGNOSIS — S43432A Superior glenoid labrum lesion of left shoulder, initial encounter: Secondary | ICD-10-CM | POA: Diagnosis not present

## 2018-09-22 DIAGNOSIS — Z793 Long term (current) use of hormonal contraceptives: Secondary | ICD-10-CM | POA: Diagnosis not present

## 2018-09-22 DIAGNOSIS — M7542 Impingement syndrome of left shoulder: Secondary | ICD-10-CM | POA: Diagnosis not present

## 2018-09-22 DIAGNOSIS — Z791 Long term (current) use of non-steroidal anti-inflammatories (NSAID): Secondary | ICD-10-CM | POA: Insufficient documentation

## 2018-09-22 DIAGNOSIS — Z6841 Body Mass Index (BMI) 40.0 and over, adult: Secondary | ICD-10-CM | POA: Diagnosis not present

## 2018-09-22 DIAGNOSIS — Z87891 Personal history of nicotine dependence: Secondary | ICD-10-CM | POA: Insufficient documentation

## 2018-09-22 DIAGNOSIS — Z79899 Other long term (current) drug therapy: Secondary | ICD-10-CM | POA: Diagnosis not present

## 2018-09-22 DIAGNOSIS — E039 Hypothyroidism, unspecified: Secondary | ICD-10-CM | POA: Diagnosis not present

## 2018-09-22 DIAGNOSIS — G8918 Other acute postprocedural pain: Secondary | ICD-10-CM | POA: Diagnosis not present

## 2018-09-22 DIAGNOSIS — I10 Essential (primary) hypertension: Secondary | ICD-10-CM | POA: Insufficient documentation

## 2018-09-22 DIAGNOSIS — Z7984 Long term (current) use of oral hypoglycemic drugs: Secondary | ICD-10-CM | POA: Insufficient documentation

## 2018-09-22 DIAGNOSIS — E119 Type 2 diabetes mellitus without complications: Secondary | ICD-10-CM | POA: Insufficient documentation

## 2018-09-22 DIAGNOSIS — Z79890 Hormone replacement therapy (postmenopausal): Secondary | ICD-10-CM | POA: Insufficient documentation

## 2018-09-22 DIAGNOSIS — X58XXXA Exposure to other specified factors, initial encounter: Secondary | ICD-10-CM | POA: Diagnosis not present

## 2018-09-22 DIAGNOSIS — S4382XA Sprain of other specified parts of left shoulder girdle, initial encounter: Secondary | ICD-10-CM | POA: Diagnosis not present

## 2018-09-22 DIAGNOSIS — M75102 Unspecified rotator cuff tear or rupture of left shoulder, not specified as traumatic: Secondary | ICD-10-CM | POA: Diagnosis not present

## 2018-09-22 DIAGNOSIS — M19012 Primary osteoarthritis, left shoulder: Secondary | ICD-10-CM | POA: Insufficient documentation

## 2018-09-22 DIAGNOSIS — M24112 Other articular cartilage disorders, left shoulder: Secondary | ICD-10-CM | POA: Diagnosis not present

## 2018-09-22 HISTORY — DX: Adverse effect of unspecified anesthetic, initial encounter: T41.45XA

## 2018-09-22 HISTORY — DX: Other complications of anesthesia, initial encounter: T88.59XA

## 2018-09-22 HISTORY — PX: BICEPT TENODESIS: SHX5116

## 2018-09-22 HISTORY — PX: ARTHOSCOPIC ROTAOR CUFF REPAIR: SHX5002

## 2018-09-22 HISTORY — PX: SHOULDER ACROMIOPLASTY: SHX6093

## 2018-09-22 HISTORY — PX: SHOULDER ARTHROSCOPY: SHX128

## 2018-09-22 HISTORY — DX: Hypothyroidism, unspecified: E03.9

## 2018-09-22 LAB — POCT PREGNANCY, URINE: PREG TEST UR: NEGATIVE

## 2018-09-22 LAB — GLUCOSE, CAPILLARY: GLUCOSE-CAPILLARY: 90 mg/dL (ref 70–99)

## 2018-09-22 SURGERY — ARTHROSCOPY, SHOULDER
Anesthesia: General | Site: Shoulder | Laterality: Left

## 2018-09-22 MED ORDER — ONDANSETRON HCL 4 MG PO TABS: 4.0000 mg | ORAL_TABLET | Freq: Three times a day (TID) | ORAL | 1 refills | Status: AC | PRN

## 2018-09-22 MED ORDER — ONDANSETRON HCL 4 MG/2ML IJ SOLN
INTRAMUSCULAR | Status: DC | PRN
  Administered 2018-09-22: 4 mg via INTRAVENOUS

## 2018-09-22 MED ORDER — MEPERIDINE HCL 50 MG/ML IJ SOLN: 6.2500 mg | INTRAMUSCULAR | Status: DC | PRN

## 2018-09-22 MED ORDER — PROPOFOL 500 MG/50ML IV EMUL
INTRAVENOUS | Status: DC | PRN
  Administered 2018-09-22: 50 ug/kg/min via INTRAVENOUS

## 2018-09-22 MED ORDER — MELOXICAM 7.5 MG PO TABS: 7.5000 mg | ORAL_TABLET | Freq: Every day | ORAL | 2 refills | Status: DC

## 2018-09-22 MED ORDER — PHENYLEPHRINE 40 MCG/ML (10ML) SYRINGE FOR IV PUSH (FOR BLOOD PRESSURE SUPPORT)
PREFILLED_SYRINGE | INTRAVENOUS | Status: DC | PRN
  Administered 2018-09-22 (×2): 80 ug via INTRAVENOUS

## 2018-09-22 MED ORDER — CHLORHEXIDINE GLUCONATE 4 % EX LIQD: 60.0000 mL | Freq: Once | CUTANEOUS | Status: DC

## 2018-09-22 MED ORDER — MIDAZOLAM HCL 2 MG/2ML IJ SOLN
1.0000 mg | Freq: Once | INTRAMUSCULAR | Status: AC
  Administered 2018-09-22: 1 mg via INTRAVENOUS

## 2018-09-22 MED ORDER — LACTATED RINGERS IV SOLN
INTRAVENOUS | Status: DC
  Administered 2018-09-22: 13:00:00 via INTRAVENOUS

## 2018-09-22 MED ORDER — DEXAMETHASONE SODIUM PHOSPHATE 10 MG/ML IJ SOLN
INTRAMUSCULAR | Status: AC
  Filled 2018-09-22: qty 1

## 2018-09-22 MED ORDER — ACETAMINOPHEN 500 MG PO TABS: 1000.0000 mg | ORAL_TABLET | Freq: Three times a day (TID) | ORAL | 0 refills | Status: AC

## 2018-09-22 MED ORDER — SODIUM CHLORIDE 0.9 % IR SOLN
Status: DC | PRN
  Administered 2018-09-22 (×7): 3000 mL

## 2018-09-22 MED ORDER — MIDAZOLAM HCL 2 MG/2ML IJ SOLN
INTRAMUSCULAR | Status: AC
  Administered 2018-09-22: 1 mg via INTRAVENOUS
  Filled 2018-09-22: qty 2

## 2018-09-22 MED ORDER — DEXAMETHASONE SODIUM PHOSPHATE 10 MG/ML IJ SOLN
INTRAMUSCULAR | Status: DC | PRN
  Administered 2018-09-22: 10 mg via INTRAVENOUS

## 2018-09-22 MED ORDER — FENTANYL CITRATE (PF) 100 MCG/2ML IJ SOLN
50.0000 ug | Freq: Once | INTRAMUSCULAR | Status: AC
  Administered 2018-09-22: 50 ug via INTRAVENOUS

## 2018-09-22 MED ORDER — HYDROMORPHONE HCL 1 MG/ML IJ SOLN: 0.2500 mg | INTRAMUSCULAR | Status: DC | PRN

## 2018-09-22 MED ORDER — DEXAMETHASONE SODIUM PHOSPHATE 10 MG/ML IJ SOLN: INTRAMUSCULAR | Status: DC | PRN

## 2018-09-22 MED ORDER — ONDANSETRON HCL 4 MG/2ML IJ SOLN
INTRAMUSCULAR | Status: AC
  Filled 2018-09-22: qty 2

## 2018-09-22 MED ORDER — LIDOCAINE 2% (20 MG/ML) 5 ML SYRINGE
INTRAMUSCULAR | Status: DC | PRN
  Administered 2018-09-22: 60 mg via INTRAVENOUS

## 2018-09-22 MED ORDER — SUGAMMADEX SODIUM 500 MG/5ML IV SOLN
INTRAVENOUS | Status: DC | PRN
  Administered 2018-09-22: 400 mg via INTRAVENOUS

## 2018-09-22 MED ORDER — FENTANYL CITRATE (PF) 100 MCG/2ML IJ SOLN
INTRAMUSCULAR | Status: AC
  Administered 2018-09-22: 50 ug via INTRAVENOUS
  Filled 2018-09-22: qty 2

## 2018-09-22 MED ORDER — SUGAMMADEX SODIUM 200 MG/2ML IV SOLN
INTRAVENOUS | Status: AC
  Filled 2018-09-22: qty 2

## 2018-09-22 MED ORDER — OXYCODONE HCL 5 MG PO TABS: ORAL_TABLET | ORAL | 0 refills | Status: AC

## 2018-09-22 MED ORDER — OMEPRAZOLE 20 MG PO CPDR: 20.0000 mg | DELAYED_RELEASE_CAPSULE | Freq: Every day | ORAL | 0 refills | Status: DC

## 2018-09-22 MED ORDER — ROCURONIUM BROMIDE 10 MG/ML (PF) SYRINGE
PREFILLED_SYRINGE | INTRAVENOUS | Status: DC | PRN
  Administered 2018-09-22: 10 mg via INTRAVENOUS
  Administered 2018-09-22: 50 mg via INTRAVENOUS
  Administered 2018-09-22: 20 mg via INTRAVENOUS

## 2018-09-22 MED ORDER — EPINEPHRINE PF 1 MG/ML IJ SOLN
INTRAMUSCULAR | Status: AC
  Filled 2018-09-22: qty 4

## 2018-09-22 MED ORDER — FENTANYL CITRATE (PF) 250 MCG/5ML IJ SOLN
INTRAMUSCULAR | Status: AC
  Filled 2018-09-22: qty 5

## 2018-09-22 MED ORDER — LACTATED RINGERS IV SOLN
INTRAVENOUS | Status: DC | PRN
  Administered 2018-09-22 (×2): via INTRAVENOUS

## 2018-09-22 MED ORDER — MIDAZOLAM HCL 2 MG/2ML IJ SOLN
INTRAMUSCULAR | Status: AC
  Filled 2018-09-22: qty 2

## 2018-09-22 MED ORDER — MIDAZOLAM HCL 2 MG/2ML IJ SOLN: 0.5000 mg | Freq: Once | INTRAMUSCULAR | Status: DC | PRN

## 2018-09-22 MED ORDER — PROMETHAZINE HCL 25 MG/ML IJ SOLN: 6.2500 mg | INTRAMUSCULAR | Status: DC | PRN

## 2018-09-22 MED ORDER — PROPOFOL 10 MG/ML IV BOLUS
INTRAVENOUS | Status: DC | PRN
  Administered 2018-09-22: 160 mg via INTRAVENOUS

## 2018-09-22 MED ORDER — SODIUM CHLORIDE 0.9 % IV SOLN
INTRAVENOUS | Status: AC | PRN
  Administered 2018-09-22: 1000 mL via INTRAMUSCULAR

## 2018-09-22 MED ORDER — SODIUM CHLORIDE 0.9 % IV SOLN
INTRAVENOUS | Status: DC | PRN
  Administered 2018-09-22: 50 ug/min via INTRAVENOUS

## 2018-09-22 SURGICAL SUPPLY — 55 items
AID PSTN UNV HD RSTRNT DISP (MISCELLANEOUS) ×1
ANCH SUT SWLK 19.1X4.75 (Anchor) ×2 IMPLANT
ANCHOR SUT BIO SW 4.75X19.1 (Anchor) ×2 IMPLANT
APL SKNCLS STERI-STRIP NONHPOA (GAUZE/BANDAGES/DRESSINGS) ×1
BENZOIN TINCTURE PRP APPL 2/3 (GAUZE/BANDAGES/DRESSINGS) ×2 IMPLANT
BLADE CUTTER GATOR 3.5 (BLADE) ×2 IMPLANT
BLADE GREAT WHITE 4.2 (BLADE) ×1 IMPLANT
BUR OVAL 6.0 (BURR) ×2 IMPLANT
CANNULA 5.75X71 LONG (CANNULA) IMPLANT
CANNULA TWIST IN 8.25X7CM (CANNULA) ×3 IMPLANT
COVER WAND RF STERILE (DRAPES) ×2 IMPLANT
DRAPE ORTHO SPLIT 77X108 STRL (DRAPES) ×4
DRAPE STERI 35X30 U-POUCH (DRAPES) ×4 IMPLANT
DRAPE SURG ORHT 6 SPLT 77X108 (DRAPES) ×2 IMPLANT
DRAPE U-SHAPE 47X51 STRL (DRAPES) ×2 IMPLANT
DRSG EMULSION OIL 3X3 NADH (GAUZE/BANDAGES/DRESSINGS) ×2 IMPLANT
DURAPREP 26ML APPLICATOR (WOUND CARE) ×2 IMPLANT
ELECT REM PT RETURN 9FT ADLT (ELECTROSURGICAL)
ELECTRODE REM PT RTRN 9FT ADLT (ELECTROSURGICAL) IMPLANT
GAUZE SPONGE 4X4 12PLY STRL (GAUZE/BANDAGES/DRESSINGS) ×4 IMPLANT
GLOVE BIO SURGEON STRL SZ7 (GLOVE) ×2 IMPLANT
GLOVE BIOGEL PI IND STRL 7.0 (GLOVE) ×1 IMPLANT
GLOVE BIOGEL PI IND STRL 8 (GLOVE) ×1 IMPLANT
GLOVE BIOGEL PI INDICATOR 7.0 (GLOVE) ×1
GLOVE BIOGEL PI INDICATOR 8 (GLOVE) ×1
GLOVE ECLIPSE 8.0 STRL XLNG CF (GLOVE) ×4 IMPLANT
GOWN STRL REUS W/ TWL LRG LVL3 (GOWN DISPOSABLE) ×2 IMPLANT
GOWN STRL REUS W/ TWL XL LVL3 (GOWN DISPOSABLE) ×2 IMPLANT
GOWN STRL REUS W/TWL LRG LVL3 (GOWN DISPOSABLE) ×4
GOWN STRL REUS W/TWL XL LVL3 (GOWN DISPOSABLE) ×4
IMP SYSTEM BRIDGE 4.75X19.1 (Anchor) ×2 IMPLANT
IMPL SYSTEM BRIDGE 4.75X19.1 (Anchor) IMPLANT
KIT BASIN OR (CUSTOM PROCEDURE TRAY) ×2 IMPLANT
MANIFOLD NEPTUNE II (INSTRUMENTS) ×2 IMPLANT
NDL SCORPION MULTI FIRE (NEEDLE) IMPLANT
NEEDLE SCORPION MULTI FIRE (NEEDLE) ×2 IMPLANT
NS IRRIG 1000ML POUR BTL (IV SOLUTION) IMPLANT
PACK ARTHROSCOPY DSU (CUSTOM PROCEDURE TRAY) ×2 IMPLANT
PACK SHOULDER (CUSTOM PROCEDURE TRAY) ×2 IMPLANT
PAD ABD 8X10 STRL (GAUZE/BANDAGES/DRESSINGS) ×2 IMPLANT
RESTRAINT HEAD UNIVERSAL NS (MISCELLANEOUS) ×2 IMPLANT
SET ARTHROSCOPY TUBING (MISCELLANEOUS) ×2
SET ARTHROSCOPY TUBING LN (MISCELLANEOUS) ×1 IMPLANT
SLING ARM FOAM STRAP MED (SOFTGOODS) IMPLANT
SLING ARM IMMOBILIZER LRG (SOFTGOODS) IMPLANT
SLING ARM IMMOBILIZER MED (SOFTGOODS) IMPLANT
SLING ARM XL FOAM STRAP (SOFTGOODS) IMPLANT
SPONGE LAP 4X18 RFD (DISPOSABLE) IMPLANT
STRIP CLOSURE SKIN 1/2X4 (GAUZE/BANDAGES/DRESSINGS) IMPLANT
SUT ETHILON 3 0 PS 1 (SUTURE) ×2 IMPLANT
SUT TIGER TAPE 7 IN WHITE (SUTURE) IMPLANT
TAPE FIBER 2MM 7IN #2 BLUE (SUTURE) ×2 IMPLANT
TOWEL OR 17X24 6PK STRL BLUE (TOWEL DISPOSABLE) ×2 IMPLANT
WAND HAND CNTRL MULTIVAC 90 (MISCELLANEOUS) ×2 IMPLANT
WATER STERILE IRR 3000ML UROMA (IV SOLUTION) ×2 IMPLANT

## 2018-09-22 NOTE — Op Note (Signed)
Orthopaedic Surgery Operative Note (CSN: [id number])  Mariah Baker  Nov 21, 1970 Date of Surgery: 09/22/2018   Diagnoses:  Left massive rotator cuff tear, supra, infra, subscapularis, SLAP tear, impignement, AC arthrosis  Procedure: Rotator cuff repair Biceps tenotomy Subacromial Decompression Distal Clavicle excision   Operative Finding Successful completion of planned procedure.  Massive cuff repair with significant risk for re-tear.  Excellent approximation of tendon and repair performed with medial tiedown.  If patient needed further surgery she has some articular wear  Exam under anesthesia: Full motion no limitation Articular space: No loose bodies, capsule intact, extensive labral tearing 6-9 o'clock debrided with a shaver Chondral surfaces: Mild anterior glenoid wear grade 2 though humeral head is intact. Biceps: SLAP tear with significant injection of the biceps, tenotomy performed Subscapularis: Upper 25% tear repaired with single anchor. Superior Cuff: Massive tear supra and infraspinatus retracted near to the glenoid Bursal side: Type II acromion converted type I.  Distal clavicle excision performed 10 mm.  Post-operative plan: The patient will be NWB in sling w no PT until 6 weeks.  The patient will be dc home.  DVT prophylaxis not indicated in ambulatory upper extremity patient.  Pain control with PRN pain medication preferring oral medicines.  Follow up plan will be scheduled in approximately 7 days for incision check and XR.  Post-Op Diagnosis: Same Surgeons:Primary: Bjorn PippinVarkey, Juron Vorhees T, MD Assistants:Brandon Marcell BarlowParry OPAC [facility] ROOM 04 Anesthesia: Choice Antibiotics: Ancef 3g Tourniquet time: * No tourniquets in log * Estimated Blood Loss: minimal Complications: None Specimens: None Implants: Implant Name Type Inv. Item Serial No. Manufacturer Lot No. LRB No. Used Action  ANCHOR SUT BIO SW 4.75X19.1 - [id number] Anchor ANCHOR SUT BIO SW 4.75X19.1  [employer]  [id number] Left 2 Implanted  IMP SYSTEM BRIDGE 4.75X19.1 - [id number] Anchor IMP SYSTEM BRIDGE 4.75X19.1  [employer] [id number] Left 1 Implanted    Indications for Surgery:   Mariah Baker is a 47 y.o. female with left shoulder pain refractory to nonoperative measures including injection physical therapy and anti-inflammatories.  MRI demonstrated a massive rotator cuff tear that looked irreparable.  Benefits and risks of operative and nonoperative management were discussed prior to surgery with patient/guardian(s) and informed consent form was completed.  Specific risks including infection, need for additional surgery, risk of re-tear approaching 40%, stiffness, continued pain, need for revision or reverse social arthroplasty   Procedure:   The patient was identified in the preoperative holding area where the surgical site was marked. The patient was taken to the OR where a procedural timeout was called and the above noted anesthesia was induced.  The patient was positioned beachchair on [employer] table.  Preoperative antibiotics were dosed.  The patient's left shoulder was prepped and draped in the usual sterile fashion.  A second preoperative timeout was called.      Patient was correctly identified in the preoperative holding area and operative site marked.  Patient brought to OR and positioned beachchair on an [place] table ensuring that all bony prominences were padded and the head was in an appropriate location.  Anesthesia was induced and the operative shoulder was prepped and draped in the usual sterile fashion.  Timeout was called preincision.  A standard posterior viewing portal was made after localizing the portal with a spinal needle.  An anterior accessory portal was also made.  After clearing the articular space the camera was positioned in the subacromial space.  Findings above.  Extensive debridement was performed of the articular surface  including the labrum and biceps stump.  A biceps  tenotomy was performed with a basket device.  Stump was resected back to a stable base.  Subacromial decompression: We made a lateral portal with spinal needle guidance. We then proceeded to debride bursal tissue extensively with a shaver and arthrocare device. At that point we continued to identify the borders of the acromion and identify the spur. We then carefully preserved the deltoid fascia and used a burr to convert the type 2 acromion to a Type 1 flat acromion without issue.  Distal Clavicle resection:  The scope was placed in the subacromial space from the posterior portal.  A hemostat was placed through the anterior portal and we spread at the [facility] joint.  A burr was then inserted and 10 mm of distal clavicle was resected taking care to avoid damage to the capsule around the joint and avoiding overhanging bone posteriorly.    Subscapularis Repair: We identified a subscapularis tear that involved 25% upper border.  Using a grasping device were able to demonstrate that the tendon could be reapproximated to the lesser tuberosity.  We felt that it was repairable.  We then used a RF ablator to open the rotator interval and released the MGHL to allow further translation of the subscapularis.  This point we cleared both anterior and posterior to the tendon taking care to avoid inferior migration to avoid the neurovascular structures as well as the muscular cutaneous nerve.  We then used a scorpion to pass a fiber tape in a mattress fashion through the subscapularis and based 4.75 swivel lock was used to repair back to the lesser tuberosity after prepping the tuberosity extensively.  We had good approximation of the tendon and a recreation of the rolled border.  Arthroscopic Rotator Cuff Repair: Tuberosity was prepared with a burr to a bleeding bed.  Following completion of the above we placed 3 4.7 Swivelock anchor loaded with a tape at inserted at the medial articular margin and an scorpion suture passing  device, shuttled  sutures medially in a horizontal mattress suture configuration.  We then tied using arthroscopic knot tying techniques  each suture to its partner reducing the tendon at the prepared insertion site.  These FiberWire sutures were cut after being tied.  The fiber tape was not tied. With a medial row suture limbs then incorporated, 3 anteriorly and  3 posteriorly, into each of two 4.75 PEEK SwiveLock anchors, each placed 8 to 10 mm below the tip of the tuberosity and spanning anterior-posterior width of the tear with care to avoid over tensioning.    The incisions were closed with absorbable monocryl, benzoin and steri strips.  A sterile dressing was placed along with a sling. The patient was awoken from general anesthesia and taken to the PACU in stable condition without complication.   Janace Litten, OPA-C, present and scrubbed throughout the case, critical for completion in a timely fashion, and for retraction, instrumentation, closure.

## 2018-09-22 NOTE — Transfer of Care (Signed)
Immediate Anesthesia Transfer of Care Note  Patient: Mariah Baker  Procedure(s) Performed: ARTHROSCOPY SHOULDER WITH CLAVICALECTOMY (Left Shoulder) ARTHROSCOPIC ROTATOR CUFF REPAIR (Left Shoulder) SHOULDER ACROMIOPLASTY (Left Shoulder) BICEPS TENODESIS (Left Shoulder)  Patient Location: PACU  Anesthesia Type:GA combined with regional for post-op pain  Level of Consciousness: awake, alert  and oriented  Airway & Oxygen Therapy: Patient Spontanous Breathing and Patient connected to nasal cannula oxygen  Post-op Assessment: Report given to RN and Post -op Vital signs reviewed and stable  Post vital signs: Reviewed and stable  Last Vitals:  Vitals Value Taken Time  BP 134/99 09/22/2018  4:56 PM  Temp 36.5 C 09/22/2018  4:56 PM  Pulse 77 09/22/2018  5:00 PM  Resp 20 09/22/2018  5:00 PM  SpO2 92 % 09/22/2018  5:00 PM  Vitals shown include unvalidated device data.  Last Pain:  Vitals:   09/22/18 1238  TempSrc:   PainSc: 5       Patients Stated Pain Goal: 3 (09/22/18 1238)  Complications: No apparent anesthesia complications

## 2018-09-22 NOTE — Anesthesia Procedure Notes (Signed)
Anesthesia Regional Block: Interscalene brachial plexus block   Pre-Anesthetic Checklist: ,, timeout performed, Correct Patient, Correct Site, Correct Laterality, Correct Procedure, Correct Position, site marked, Risks and benefits discussed,  Surgical consent,  Pre-op evaluation,  At surgeon's request and post-op pain management  Laterality: Left  Prep: chloraprep       Needles:  Injection technique: Single-shot  Needle Type: Stimulator Needle - 40      Needle Gauge: 22     Additional Needles:   Procedures:, nerve stimulator,,,,,,,  Narrative:  Start time: 09/22/2018 2:10 PM End time: 09/22/2018 2:15 PM Injection made incrementally with aspirations every 5 mL.  Performed by: Personally  Anesthesiologist: Kipp BroodJoslin, Evonna Stoltz, MD  Additional Notes: 15 cc 0.75%

## 2018-09-22 NOTE — Anesthesia Procedure Notes (Signed)
Procedure Name: Intubation Date/Time: 09/22/2018 2:33 PM Performed by: Eligha Bridegroom, CRNA Pre-anesthesia Checklist: Patient identified, Emergency Drugs available, Suction available, Patient being monitored and Timeout performed Patient Re-evaluated:Patient Re-evaluated prior to induction Oxygen Delivery Method: Circle system utilized Preoxygenation: Pre-oxygenation with 100% oxygen Induction Type: IV induction Ventilation: Mask ventilation without difficulty and Oral airway inserted - appropriate to patient size Laryngoscope Size: Mac and 4 Grade View: Grade II Tube type: Oral Tube size: 7.0 mm Airway Equipment and Method: Stylet Secured at: 21 cm Tube secured with: Tape Dental Injury: Teeth and Oropharynx as per pre-operative assessment

## 2018-09-22 NOTE — Anesthesia Preprocedure Evaluation (Addendum)
Anesthesia Evaluation  Patient identified by MRN, date of birth, ID band Patient awake    Reviewed: Allergy & Precautions, NPO status , Patient's Chart, lab work & pertinent test results  Airway Mallampati: II  TM Distance: >3 FB Neck ROM: Full    Dental  (+) Teeth Intact, Dental Advisory Given   Pulmonary former smoker (quit 2005),    breath sounds clear to auscultation       Cardiovascular hypertension,  Rhythm:Regular Rate:Normal     Neuro/Psych    GI/Hepatic   Endo/Other  diabetes (glu 90), Oral Hypoglycemic AgentsHypothyroidism Morbid obesity  Renal/GU      Musculoskeletal   Abdominal (+) + obese,   Peds  Hematology   Anesthesia Other Findings   Reproductive/Obstetrics OCPs                            Anesthesia Physical Anesthesia Plan  ASA: III  Anesthesia Plan: General   Post-op Pain Management:  Regional for Post-op pain   Induction: Intravenous  PONV Risk Score and Plan: Ondansetron  Airway Management Planned: Oral ETT  Additional Equipment:   Intra-op Plan:   Post-operative Plan: Extubation in OR  Informed Consent: I have reviewed the patients History and Physical, chart, labs and discussed the procedure including the risks, benefits and alternatives for the proposed anesthesia with the patient or authorized representative who has indicated his/her understanding and acceptance.   Dental advisory given  Plan Discussed with: CRNA and Anesthesiologist  Anesthesia Plan Comments:         Anesthesia Quick Evaluation

## 2018-09-22 NOTE — H&P (Signed)
PREOPERATIVE H&P  Chief Complaint: LEFT SHOULDER CARTILEDGE DISORDER,ROTATOR CUFF TEAR, IMPINGEMENT SYNDROME  HPI: Mariah Baker is a 47 y.o. female who presents for preoperative history and physical with a diagnosis of LEFT SHOULDER CARTILEDGE DISORDER,ROTATOR CUFF TEAR, IMPINGEMENT SYNDROME. Symptoms are rated as moderate to severe, and have been worsening.  This is significantly impairing activities of daily living.  Please see my clinic note for full details on this patient's care.  She has elected for surgical management.   Past Medical History:  Diagnosis Date  . Complication of anesthesia    takes a little bit longer to wake up  . Diabetes mellitus    Type 2  . Hypertension   . Hypothyroidism    "running high on my thyroid tests on low dose synthroid   Past Surgical History:  Procedure Laterality Date  . KNEE ARTHROSCOPY    . TONSILECTOMY, ADENOIDECTOMY, BILATERAL MYRINGOTOMY AND TUBES     Social History   Socioeconomic History  . Marital status: Married    Spouse name: Not on file  . Number of children: Not on file  . Years of education: Not on file  . Highest education level: Not on file  Occupational History  . Not on file  Social Needs  . Financial resource strain: Not on file  . Food insecurity:    Worry: Not on file    Inability: Not on file  . Transportation needs:    Medical: Not on file    Non-medical: Not on file  Tobacco Use  . Smoking status: Former Smoker    Last attempt to quit: 12/12/2003    Years since quitting: 14.7  . Smokeless tobacco: Never Used  Substance and Sexual Activity  . Alcohol use: Yes    Comment: rarely, not in months  . Drug use: No  . Sexual activity: Yes    Birth control/protection: Pill  Lifestyle  . Physical activity:    Days per week: Not on file    Minutes per session: Not on file  . Stress: Not on file  Relationships  . Social connections:    Talks on phone: Not on file    Gets together: Not on file    Attends  religious service: Not on file    Active member of club or organization: Not on file    Attends meetings of clubs or organizations: Not on file    Relationship status: Not on file  Other Topics Concern  . Not on file  Social History Narrative  . Not on file   Family History  Adopted: Yes  Family history unknown: Yes   No Known Allergies Prior to Admission medications   Medication Sig Start Date End Date Taking? Authorizing Provider  levothyroxine (SYNTHROID, LEVOTHROID) 50 MCG tablet Take 50 mcg by mouth daily before breakfast.   Yes [provider]  losartan (COZAAR) 100 MG tablet Take 100 mg by mouth daily.   Yes [provider]  metFORMIN (GLUCOPHAGE) 500 MG tablet Take 500 mg by mouth 2 (two) times daily with a meal.   Yes [provider]  norethindrone (MICRONOR,CAMILA,ERRIN) 0.35 MG tablet TAKE ONE (1) TABLET BY MOUTH EVERY DAY Patient taking differently: Take 1 tablet by mouth daily.  11/13/17  Yes Genia Del, MD  topiramate (TOPAMAX) 100 MG tablet Take 50 mg by mouth at bedtime.    Yes [provider]  acetaminophen (TYLENOL) 500 MG tablet Take 2 tablets (1,000 mg total) by mouth every 8 (eight) hours  for 14 days. 09/22/18 10/06/18  Bjorn PippinVarkey, Jhonny Calixto T, MD  meloxicam (MOBIC) 7.5 MG tablet Take 1 tablet (7.5 mg total) by mouth daily. 09/22/18 09/22/19  Bjorn PippinVarkey, Rena Hunke T, MD  Omega-3 Fatty Acids (FISH OIL) 1000 MG CAPS Take 1,000 mg by mouth daily.    [provider]  omeprazole (PRILOSEC) 20 MG capsule Take 1 capsule (20 mg total) by mouth daily for 14 days. 09/22/18 10/06/18  Bjorn PippinVarkey, Ashante Yellin T, MD  ondansetron (ZOFRAN) 4 MG tablet Take 1 tablet (4 mg total) by mouth every 8 (eight) hours as needed for up to 7 days for nausea or vomiting. 09/22/18 09/29/18  Bjorn PippinVarkey, Kara Mierzejewski T, MD  oxyCODONE (OXY IR/ROXICODONE) 5 MG immediate release tablet Take 1-2 pills every 6 hrs as needed for pain, no more than 6 per day 09/22/18 09/27/18  Bjorn PippinVarkey, Aletha Allebach T, MD   POTASSIUM PO Take 1 tablet by mouth daily.    [provider]     Positive ROS: All other systems have been reviewed and were otherwise negative with the exception of those mentioned in the HPI and as above.  Physical Exam: General: Alert, no acute distress Cardiovascular: No pedal edema Respiratory: No cyanosis, no use of accessory musculature GI: No organomegaly, abdomen is soft and non-tender Skin: No lesions in the area of chief complaint Neurologic: Sensation intact distally Psychiatric: Patient is competent for consent with normal mood and affect Lymphatic: No axillary or cervical lymphadenopathy  MUSCULOSKELETAL: L shoulder: painful motion, weak cuff, +AC  Assessment: LEFT SHOULDER CARTILEDGE DISORDER,ROTATOR CUFF TEAR, IMPINGEMENT SYNDROME  Plan: Plan for Procedure(s): ARTHROSCOPY SHOULDER WITH CLAVICALECTOMY ARTHROSCOPIC ROTATOR CUFF REPAIR SHOULDER ACROMIOPLASTY BICEPS TENODESIS  The risks benefits and alternatives were discussed with the patient including but not limited to the risks of nonoperative treatment, versus surgical intervention including infection, bleeding, nerve injury,  blood clots, cardiopulmonary complications, morbidity, mortality, among others, and they were willing to proceed.   Bjorn Pippinax T Ginnie Marich, MD  09/22/2018 2:02 PM

## 2018-09-23 ENCOUNTER — Encounter (HOSPITAL_COMMUNITY): Payer: Self-pay | Admitting: Orthopaedic Surgery

## 2018-09-23 LAB — GLUCOSE, CAPILLARY: Glucose-Capillary: 126 mg/dL — ABNORMAL HIGH (ref 70–99)

## 2018-09-24 NOTE — Anesthesia Postprocedure Evaluation (Signed)
Anesthesia Post Note  Patient: Mariah Baker  Procedure(s) Performed: ARTHROSCOPY SHOULDER WITH CLAVICALECTOMY (Left Shoulder) ARTHROSCOPIC ROTATOR CUFF REPAIR (Left Shoulder) SHOULDER ACROMIOPLASTY (Left Shoulder) BICEPS TENODESIS (Left Shoulder)     Patient location during evaluation: PACU Anesthesia Type: General Level of consciousness: awake and alert Pain management: pain level controlled Vital Signs Assessment: post-procedure vital signs reviewed and stable Respiratory status: spontaneous breathing, nonlabored ventilation, respiratory function stable and patient connected to nasal cannula oxygen Cardiovascular status: blood pressure returned to baseline and stable Postop Assessment: no apparent nausea or vomiting Anesthetic complications: no    Last Vitals:  Vitals:   09/22/18 1738 09/22/18 1754  BP: (!) 145/66 132/76  Pulse: 66 67  Resp: 18 18  Temp: 36.7 C   SpO2: 95% 94%    Last Pain:  Vitals:   09/22/18 1738  TempSrc:   PainSc: 0-No pain                 Erryn Dickison COKER

## 2018-09-30 DIAGNOSIS — M19012 Primary osteoarthritis, left shoulder: Secondary | ICD-10-CM | POA: Diagnosis not present

## 2018-10-28 DIAGNOSIS — M19012 Primary osteoarthritis, left shoulder: Secondary | ICD-10-CM | POA: Diagnosis not present

## 2018-11-12 DIAGNOSIS — S43422D Sprain of left rotator cuff capsule, subsequent encounter: Secondary | ICD-10-CM | POA: Diagnosis not present

## 2018-11-12 DIAGNOSIS — M25612 Stiffness of left shoulder, not elsewhere classified: Secondary | ICD-10-CM | POA: Diagnosis not present

## 2018-11-12 DIAGNOSIS — M6281 Muscle weakness (generalized): Secondary | ICD-10-CM | POA: Diagnosis not present

## 2018-11-12 DIAGNOSIS — M25512 Pain in left shoulder: Secondary | ICD-10-CM | POA: Diagnosis not present

## 2018-11-18 DIAGNOSIS — M6281 Muscle weakness (generalized): Secondary | ICD-10-CM | POA: Diagnosis not present

## 2018-11-18 DIAGNOSIS — M25612 Stiffness of left shoulder, not elsewhere classified: Secondary | ICD-10-CM | POA: Diagnosis not present

## 2018-11-18 DIAGNOSIS — M25512 Pain in left shoulder: Secondary | ICD-10-CM | POA: Diagnosis not present

## 2018-11-18 DIAGNOSIS — S43422D Sprain of left rotator cuff capsule, subsequent encounter: Secondary | ICD-10-CM | POA: Diagnosis not present

## 2018-11-19 DIAGNOSIS — M25612 Stiffness of left shoulder, not elsewhere classified: Secondary | ICD-10-CM | POA: Diagnosis not present

## 2018-11-19 DIAGNOSIS — S43422D Sprain of left rotator cuff capsule, subsequent encounter: Secondary | ICD-10-CM | POA: Diagnosis not present

## 2018-11-19 DIAGNOSIS — M6281 Muscle weakness (generalized): Secondary | ICD-10-CM | POA: Diagnosis not present

## 2018-11-19 DIAGNOSIS — M25512 Pain in left shoulder: Secondary | ICD-10-CM | POA: Diagnosis not present

## 2018-11-24 DIAGNOSIS — S43422D Sprain of left rotator cuff capsule, subsequent encounter: Secondary | ICD-10-CM | POA: Diagnosis not present

## 2018-11-24 DIAGNOSIS — M25512 Pain in left shoulder: Secondary | ICD-10-CM | POA: Diagnosis not present

## 2018-11-24 DIAGNOSIS — M25612 Stiffness of left shoulder, not elsewhere classified: Secondary | ICD-10-CM | POA: Diagnosis not present

## 2018-11-24 DIAGNOSIS — M6281 Muscle weakness (generalized): Secondary | ICD-10-CM | POA: Diagnosis not present

## 2018-11-26 DIAGNOSIS — S42422D Displaced comminuted supracondylar fracture without intercondylar fracture of left humerus, subsequent encounter for fracture with routine healing: Secondary | ICD-10-CM | POA: Diagnosis not present

## 2018-11-26 DIAGNOSIS — M25512 Pain in left shoulder: Secondary | ICD-10-CM | POA: Diagnosis not present

## 2018-11-26 DIAGNOSIS — M25612 Stiffness of left shoulder, not elsewhere classified: Secondary | ICD-10-CM | POA: Diagnosis not present

## 2018-11-26 DIAGNOSIS — M6281 Muscle weakness (generalized): Secondary | ICD-10-CM | POA: Diagnosis not present

## 2018-12-01 DIAGNOSIS — M25612 Stiffness of left shoulder, not elsewhere classified: Secondary | ICD-10-CM | POA: Diagnosis not present

## 2018-12-01 DIAGNOSIS — M6281 Muscle weakness (generalized): Secondary | ICD-10-CM | POA: Diagnosis not present

## 2018-12-01 DIAGNOSIS — M25512 Pain in left shoulder: Secondary | ICD-10-CM | POA: Diagnosis not present

## 2018-12-01 DIAGNOSIS — S43422D Sprain of left rotator cuff capsule, subsequent encounter: Secondary | ICD-10-CM | POA: Diagnosis not present

## 2018-12-03 DIAGNOSIS — M6281 Muscle weakness (generalized): Secondary | ICD-10-CM | POA: Diagnosis not present

## 2018-12-03 DIAGNOSIS — M25512 Pain in left shoulder: Secondary | ICD-10-CM | POA: Diagnosis not present

## 2018-12-03 DIAGNOSIS — S43422D Sprain of left rotator cuff capsule, subsequent encounter: Secondary | ICD-10-CM | POA: Diagnosis not present

## 2018-12-03 DIAGNOSIS — M25612 Stiffness of left shoulder, not elsewhere classified: Secondary | ICD-10-CM | POA: Diagnosis not present

## 2018-12-08 DIAGNOSIS — S43422D Sprain of left rotator cuff capsule, subsequent encounter: Secondary | ICD-10-CM | POA: Diagnosis not present

## 2018-12-08 DIAGNOSIS — M25612 Stiffness of left shoulder, not elsewhere classified: Secondary | ICD-10-CM | POA: Diagnosis not present

## 2018-12-08 DIAGNOSIS — M6281 Muscle weakness (generalized): Secondary | ICD-10-CM | POA: Diagnosis not present

## 2018-12-08 DIAGNOSIS — M25512 Pain in left shoulder: Secondary | ICD-10-CM | POA: Diagnosis not present

## 2018-12-09 ENCOUNTER — Other Ambulatory Visit: Payer: Self-pay | Admitting: Obstetrics & Gynecology

## 2018-12-10 ENCOUNTER — Telehealth: Payer: Self-pay | Admitting: *Deleted

## 2018-12-10 DIAGNOSIS — M25512 Pain in left shoulder: Secondary | ICD-10-CM | POA: Diagnosis not present

## 2018-12-10 DIAGNOSIS — S42422D Displaced comminuted supracondylar fracture without intercondylar fracture of left humerus, subsequent encounter for fracture with routine healing: Secondary | ICD-10-CM | POA: Diagnosis not present

## 2018-12-10 DIAGNOSIS — M25612 Stiffness of left shoulder, not elsewhere classified: Secondary | ICD-10-CM | POA: Diagnosis not present

## 2018-12-10 DIAGNOSIS — M6281 Muscle weakness (generalized): Secondary | ICD-10-CM | POA: Diagnosis not present

## 2018-12-10 DIAGNOSIS — S43422D Sprain of left rotator cuff capsule, subsequent encounter: Secondary | ICD-10-CM | POA: Diagnosis not present

## 2018-12-10 MED ORDER — NORETHINDRONE 0.35 MG PO TABS: ORAL_TABLET | ORAL | 0 refills | Status: DC

## 2018-12-10 NOTE — Telephone Encounter (Signed)
Patient has annual exam scheduled on 02/25/19, needs refill on birth control pills. Rx sent.  

## 2018-12-15 DIAGNOSIS — M25612 Stiffness of left shoulder, not elsewhere classified: Secondary | ICD-10-CM | POA: Diagnosis not present

## 2018-12-15 DIAGNOSIS — S43422D Sprain of left rotator cuff capsule, subsequent encounter: Secondary | ICD-10-CM | POA: Diagnosis not present

## 2018-12-15 DIAGNOSIS — M6281 Muscle weakness (generalized): Secondary | ICD-10-CM | POA: Diagnosis not present

## 2018-12-15 DIAGNOSIS — M25512 Pain in left shoulder: Secondary | ICD-10-CM | POA: Diagnosis not present

## 2018-12-16 DIAGNOSIS — H52203 Unspecified astigmatism, bilateral: Secondary | ICD-10-CM | POA: Diagnosis not present

## 2018-12-16 DIAGNOSIS — H5213 Myopia, bilateral: Secondary | ICD-10-CM | POA: Diagnosis not present

## 2018-12-16 DIAGNOSIS — H524 Presbyopia: Secondary | ICD-10-CM | POA: Diagnosis not present

## 2018-12-16 DIAGNOSIS — E119 Type 2 diabetes mellitus without complications: Secondary | ICD-10-CM | POA: Diagnosis not present

## 2018-12-17 DIAGNOSIS — M25512 Pain in left shoulder: Secondary | ICD-10-CM | POA: Diagnosis not present

## 2018-12-17 DIAGNOSIS — M25612 Stiffness of left shoulder, not elsewhere classified: Secondary | ICD-10-CM | POA: Diagnosis not present

## 2018-12-17 DIAGNOSIS — S43422D Sprain of left rotator cuff capsule, subsequent encounter: Secondary | ICD-10-CM | POA: Diagnosis not present

## 2018-12-17 DIAGNOSIS — M6281 Muscle weakness (generalized): Secondary | ICD-10-CM | POA: Diagnosis not present

## 2018-12-22 DIAGNOSIS — M25512 Pain in left shoulder: Secondary | ICD-10-CM | POA: Diagnosis not present

## 2018-12-22 DIAGNOSIS — S43422D Sprain of left rotator cuff capsule, subsequent encounter: Secondary | ICD-10-CM | POA: Diagnosis not present

## 2018-12-22 DIAGNOSIS — M25612 Stiffness of left shoulder, not elsewhere classified: Secondary | ICD-10-CM | POA: Diagnosis not present

## 2018-12-22 DIAGNOSIS — M6281 Muscle weakness (generalized): Secondary | ICD-10-CM | POA: Diagnosis not present

## 2018-12-24 DIAGNOSIS — M6281 Muscle weakness (generalized): Secondary | ICD-10-CM | POA: Diagnosis not present

## 2018-12-24 DIAGNOSIS — M25612 Stiffness of left shoulder, not elsewhere classified: Secondary | ICD-10-CM | POA: Diagnosis not present

## 2018-12-24 DIAGNOSIS — M25512 Pain in left shoulder: Secondary | ICD-10-CM | POA: Diagnosis not present

## 2018-12-24 DIAGNOSIS — S43422D Sprain of left rotator cuff capsule, subsequent encounter: Secondary | ICD-10-CM | POA: Diagnosis not present

## 2018-12-31 DIAGNOSIS — M25612 Stiffness of left shoulder, not elsewhere classified: Secondary | ICD-10-CM | POA: Diagnosis not present

## 2018-12-31 DIAGNOSIS — M25512 Pain in left shoulder: Secondary | ICD-10-CM | POA: Diagnosis not present

## 2018-12-31 DIAGNOSIS — S43422D Sprain of left rotator cuff capsule, subsequent encounter: Secondary | ICD-10-CM | POA: Diagnosis not present

## 2018-12-31 DIAGNOSIS — M6281 Muscle weakness (generalized): Secondary | ICD-10-CM | POA: Diagnosis not present

## 2019-01-07 DIAGNOSIS — M25612 Stiffness of left shoulder, not elsewhere classified: Secondary | ICD-10-CM | POA: Diagnosis not present

## 2019-01-07 DIAGNOSIS — S46011D Strain of muscle(s) and tendon(s) of the rotator cuff of right shoulder, subsequent encounter: Secondary | ICD-10-CM | POA: Diagnosis not present

## 2019-01-07 DIAGNOSIS — M25512 Pain in left shoulder: Secondary | ICD-10-CM | POA: Diagnosis not present

## 2019-01-07 DIAGNOSIS — M6281 Muscle weakness (generalized): Secondary | ICD-10-CM | POA: Diagnosis not present

## 2019-01-12 DIAGNOSIS — S43422D Sprain of left rotator cuff capsule, subsequent encounter: Secondary | ICD-10-CM | POA: Diagnosis not present

## 2019-01-12 DIAGNOSIS — M25612 Stiffness of left shoulder, not elsewhere classified: Secondary | ICD-10-CM | POA: Diagnosis not present

## 2019-01-12 DIAGNOSIS — M6281 Muscle weakness (generalized): Secondary | ICD-10-CM | POA: Diagnosis not present

## 2019-01-12 DIAGNOSIS — M25512 Pain in left shoulder: Secondary | ICD-10-CM | POA: Diagnosis not present

## 2019-01-19 DIAGNOSIS — M25612 Stiffness of left shoulder, not elsewhere classified: Secondary | ICD-10-CM | POA: Diagnosis not present

## 2019-01-19 DIAGNOSIS — M25512 Pain in left shoulder: Secondary | ICD-10-CM | POA: Diagnosis not present

## 2019-01-19 DIAGNOSIS — S43422D Sprain of left rotator cuff capsule, subsequent encounter: Secondary | ICD-10-CM | POA: Diagnosis not present

## 2019-01-19 DIAGNOSIS — M6281 Muscle weakness (generalized): Secondary | ICD-10-CM | POA: Diagnosis not present

## 2019-01-26 DIAGNOSIS — M25612 Stiffness of left shoulder, not elsewhere classified: Secondary | ICD-10-CM | POA: Diagnosis not present

## 2019-01-26 DIAGNOSIS — M25512 Pain in left shoulder: Secondary | ICD-10-CM | POA: Diagnosis not present

## 2019-01-26 DIAGNOSIS — M6281 Muscle weakness (generalized): Secondary | ICD-10-CM | POA: Diagnosis not present

## 2019-01-26 DIAGNOSIS — S43422D Sprain of left rotator cuff capsule, subsequent encounter: Secondary | ICD-10-CM | POA: Diagnosis not present

## 2019-01-27 DIAGNOSIS — M25512 Pain in left shoulder: Secondary | ICD-10-CM | POA: Diagnosis not present

## 2019-02-04 DIAGNOSIS — S43422D Sprain of left rotator cuff capsule, subsequent encounter: Secondary | ICD-10-CM | POA: Diagnosis not present

## 2019-02-04 DIAGNOSIS — M25512 Pain in left shoulder: Secondary | ICD-10-CM | POA: Diagnosis not present

## 2019-02-04 DIAGNOSIS — M6281 Muscle weakness (generalized): Secondary | ICD-10-CM | POA: Diagnosis not present

## 2019-02-04 DIAGNOSIS — M25612 Stiffness of left shoulder, not elsewhere classified: Secondary | ICD-10-CM | POA: Diagnosis not present

## 2019-02-09 DIAGNOSIS — S43422D Sprain of left rotator cuff capsule, subsequent encounter: Secondary | ICD-10-CM | POA: Diagnosis not present

## 2019-02-09 DIAGNOSIS — M25612 Stiffness of left shoulder, not elsewhere classified: Secondary | ICD-10-CM | POA: Diagnosis not present

## 2019-02-09 DIAGNOSIS — M25512 Pain in left shoulder: Secondary | ICD-10-CM | POA: Diagnosis not present

## 2019-02-09 DIAGNOSIS — M6281 Muscle weakness (generalized): Secondary | ICD-10-CM | POA: Diagnosis not present

## 2019-02-16 DIAGNOSIS — M25612 Stiffness of left shoulder, not elsewhere classified: Secondary | ICD-10-CM | POA: Diagnosis not present

## 2019-02-16 DIAGNOSIS — S43422D Sprain of left rotator cuff capsule, subsequent encounter: Secondary | ICD-10-CM | POA: Diagnosis not present

## 2019-02-16 DIAGNOSIS — M25512 Pain in left shoulder: Secondary | ICD-10-CM | POA: Diagnosis not present

## 2019-02-16 DIAGNOSIS — M6281 Muscle weakness (generalized): Secondary | ICD-10-CM | POA: Diagnosis not present

## 2019-02-17 DIAGNOSIS — Z1389 Encounter for screening for other disorder: Secondary | ICD-10-CM | POA: Diagnosis not present

## 2019-02-17 DIAGNOSIS — H6123 Impacted cerumen, bilateral: Secondary | ICD-10-CM | POA: Diagnosis not present

## 2019-02-17 DIAGNOSIS — I10 Essential (primary) hypertension: Secondary | ICD-10-CM | POA: Diagnosis not present

## 2019-02-17 DIAGNOSIS — E7849 Other hyperlipidemia: Secondary | ICD-10-CM | POA: Diagnosis not present

## 2019-02-17 DIAGNOSIS — Z6841 Body Mass Index (BMI) 40.0 and over, adult: Secondary | ICD-10-CM | POA: Diagnosis not present

## 2019-02-17 DIAGNOSIS — E039 Hypothyroidism, unspecified: Secondary | ICD-10-CM | POA: Diagnosis not present

## 2019-02-17 DIAGNOSIS — E119 Type 2 diabetes mellitus without complications: Secondary | ICD-10-CM | POA: Diagnosis not present

## 2019-02-23 ENCOUNTER — Other Ambulatory Visit: Payer: Self-pay

## 2019-02-23 DIAGNOSIS — M6281 Muscle weakness (generalized): Secondary | ICD-10-CM | POA: Diagnosis not present

## 2019-02-23 DIAGNOSIS — M25512 Pain in left shoulder: Secondary | ICD-10-CM | POA: Diagnosis not present

## 2019-02-23 DIAGNOSIS — M25612 Stiffness of left shoulder, not elsewhere classified: Secondary | ICD-10-CM | POA: Diagnosis not present

## 2019-02-23 DIAGNOSIS — S43422D Sprain of left rotator cuff capsule, subsequent encounter: Secondary | ICD-10-CM | POA: Diagnosis not present

## 2019-02-25 ENCOUNTER — Other Ambulatory Visit: Payer: Self-pay

## 2019-02-25 ENCOUNTER — Ambulatory Visit (INDEPENDENT_AMBULATORY_CARE_PROVIDER_SITE_OTHER): Payer: BLUE CROSS/BLUE SHIELD | Admitting: Obstetrics & Gynecology

## 2019-02-25 ENCOUNTER — Encounter: Payer: Self-pay | Admitting: Obstetrics & Gynecology

## 2019-02-25 VITALS — BP 136/84 | Ht 64.0 in | Wt 305.0 lb

## 2019-02-25 DIAGNOSIS — Z01419 Encounter for gynecological examination (general) (routine) without abnormal findings: Secondary | ICD-10-CM

## 2019-02-25 DIAGNOSIS — Z3041 Encounter for surveillance of contraceptive pills: Secondary | ICD-10-CM | POA: Diagnosis not present

## 2019-02-25 MED ORDER — NORETHINDRONE 0.35 MG PO TABS: ORAL_TABLET | ORAL | 4 refills | Status: DC

## 2019-02-25 NOTE — Progress Notes (Signed)
Mariah Baker 16-Aug-1971 [id number]   History:    48 y.o. G1P1L1 Married.  Daughter is 47 yo.  RP:  Established patient presenting for annual gyn exam   HPI: Well on the Progestin-only BCP.  No BTB.  No pelvic pain.  No pain with intercourse.  Normal vaginal secretions.  Urine and bowel movements normal.  Breast normal.  Body mass index 52.35.  Had a shoulder surgery for which she is in physical therapy currently.  Able to resume physical activities now.  Health labs with family physician.  Past medical history,surgical history, family history and social history were all reviewed and documented in the EPIC chart.  Gynecologic History No LMP recorded. (Menstrual status: Oral contraceptives). Contraception: oral progesterone-only contraceptive Last Pap: 11/2017. Results were: Negative Last mammogram: 09/2017. Results were: Negative Bone Density: Never Colonoscopy: Never  Obstetric History OB History  Gravida Para Term Preterm AB Living  1 1       1   SAB TAB Ectopic Multiple Live Births               # Outcome Date GA Lbr Len/2nd Weight Sex Delivery Anes PTL Lv  1 Para              ROS: A ROS was performed and pertinent positives and negatives are included in the history.  GENERAL: No fevers or chills. HEENT: No change in vision, no earache, sore throat or sinus congestion. NECK: No pain or stiffness. CARDIOVASCULAR: No chest pain or pressure. No palpitations. PULMONARY: No shortness of breath, cough or wheeze. GASTROINTESTINAL: No abdominal pain, nausea, vomiting or diarrhea, melena or bright red blood per rectum. GENITOURINARY: No urinary frequency, urgency, hesitancy or dysuria. MUSCULOSKELETAL: No joint or muscle pain, no back pain, no recent trauma. DERMATOLOGIC: No rash, no itching, no lesions. ENDOCRINE: No polyuria, polydipsia, no heat or cold intolerance. No recent change in weight. HEMATOLOGICAL: No anemia or easy bruising or bleeding. NEUROLOGIC: No headache, seizures,  numbness, tingling or weakness. PSYCHIATRIC: No depression, no loss of interest in normal activity or change in sleep pattern.     Exam:   BP 136/84   Ht 5\' 4"  (1.626 m)   Wt (!) 305 lb (138.3 kg)   BMI 52.35 kg/m   Body mass index is 52.35 kg/m.  General appearance : Well developed well nourished female. No acute distress HEENT: Eyes: no retinal hemorrhage or exudates,  Neck supple, trachea midline, no carotid bruits, no thyroidmegaly Lungs: Clear to auscultation, no rhonchi or wheezes, or rib retractions  Heart: Regular rate and rhythm, no murmurs or gallops Breast:Examined in sitting and supine position were symmetrical in appearance, no palpable masses or tenderness,  no skin retraction, no nipple inversion, no nipple discharge, no skin discoloration, no axillary or supraclavicular lymphadenopathy Abdomen: no palpable masses or tenderness, no rebound or guarding Extremities: no edema or skin discoloration or tenderness  Pelvic: Vulva: Normal             Vagina: No gross lesions or discharge  Cervix: No gross lesions or discharge  Uterus  AV, normal size, shape and consistency, non-tender and mobile  Adnexa  Without masses or tenderness  Anus: Normal   Assessment/Plan:  48 y.o. female for annual exam   1. Well female exam with routine gynecological exam Normal gynecologic exam.  Pap test January 2019 was negative, no indication to repeat this year.  Breast exam normal.  Last screening mammogram November 2018 was negative, patient will schedule screening  mammogram now.  Health labs with family physician.  2. Encounter for surveillance of contraceptive pills Well on the progestin only birth control pill.  No contraindication to continue.  Prescription sent to pharmacy.  3. Obesity, morbid, BMI 50 or higher (HCC) Recommend lower calorie/carb diet such as [employer].  Increase physical activities with aerobic activities 5 times a week and weightlifting every 2 days.   Other orders - norethindrone (MICRONOR) 0.35 MG tablet; TAKE ONE (1) TABLET BY MOUTH EVERY DAY  Genia Del MD, 8:14 AM 02/25/2019

## 2019-02-25 NOTE — Patient Instructions (Signed)
1. Well female exam with routine gynecological exam Normal gynecologic exam.  Pap test January 2019 was negative, no indication to repeat this year.  Breast exam normal.  Last screening mammogram November 2018 was negative, patient will schedule screening mammogram now.  Health labs with family physician.  2. Encounter for surveillance of contraceptive pills Well on the progestin only birth control pill.  No contraindication to continue.  Prescription sent to pharmacy.  3. Obesity, morbid, BMI 50 or higher (HCC) Recommend lower calorie/carb diet such as [employer].  Increase physical activities with aerobic activities 5 times a week and weightlifting every 2 days.  Other orders - norethindrone (MICRONOR) 0.35 MG tablet; TAKE ONE (1) TABLET BY MOUTH EVERY DAY  Nobuko, it was a pleasure seeing you today!

## 2019-03-02 DIAGNOSIS — M6281 Muscle weakness (generalized): Secondary | ICD-10-CM | POA: Diagnosis not present

## 2019-03-02 DIAGNOSIS — M25612 Stiffness of left shoulder, not elsewhere classified: Secondary | ICD-10-CM | POA: Diagnosis not present

## 2019-03-02 DIAGNOSIS — S43422D Sprain of left rotator cuff capsule, subsequent encounter: Secondary | ICD-10-CM | POA: Diagnosis not present

## 2019-03-02 DIAGNOSIS — M25512 Pain in left shoulder: Secondary | ICD-10-CM | POA: Diagnosis not present

## 2019-03-09 DIAGNOSIS — M25612 Stiffness of left shoulder, not elsewhere classified: Secondary | ICD-10-CM | POA: Diagnosis not present

## 2019-03-09 DIAGNOSIS — M25512 Pain in left shoulder: Secondary | ICD-10-CM | POA: Diagnosis not present

## 2019-03-09 DIAGNOSIS — S43422D Sprain of left rotator cuff capsule, subsequent encounter: Secondary | ICD-10-CM | POA: Diagnosis not present

## 2019-03-09 DIAGNOSIS — M6281 Muscle weakness (generalized): Secondary | ICD-10-CM | POA: Diagnosis not present

## 2019-03-16 DIAGNOSIS — M25612 Stiffness of left shoulder, not elsewhere classified: Secondary | ICD-10-CM | POA: Diagnosis not present

## 2019-03-16 DIAGNOSIS — S43422D Sprain of left rotator cuff capsule, subsequent encounter: Secondary | ICD-10-CM | POA: Diagnosis not present

## 2019-03-16 DIAGNOSIS — M6281 Muscle weakness (generalized): Secondary | ICD-10-CM | POA: Diagnosis not present

## 2019-03-23 DIAGNOSIS — M6281 Muscle weakness (generalized): Secondary | ICD-10-CM | POA: Diagnosis not present

## 2019-03-23 DIAGNOSIS — S43422D Sprain of left rotator cuff capsule, subsequent encounter: Secondary | ICD-10-CM | POA: Diagnosis not present

## 2019-03-23 DIAGNOSIS — M25612 Stiffness of left shoulder, not elsewhere classified: Secondary | ICD-10-CM | POA: Diagnosis not present

## 2019-03-23 DIAGNOSIS — M25512 Pain in left shoulder: Secondary | ICD-10-CM | POA: Diagnosis not present

## 2019-04-04 IMAGING — MR MR SHOULDER*L* W/O CM
4 of 5 series · 19 of 40 positions shown · non-contrast
Comparison: None.

CLINICAL DATA: Left shoulder pain and weakness with limited range
of motion.

EXAM:
MRI OF THE LEFT SHOULDER WITHOUT CONTRAST
TECHNIQUE: Multiplanar, multisequence MR imaging of the shoulder was performed.
No intravenous contrast was administered.

[Series 6: PD fat-sat · axial · left · 4.0mm · 0.47mm/px · z∈[-91,-0]mm · 6 of 20 slices shown (1 of 2)]
[im 1/20]
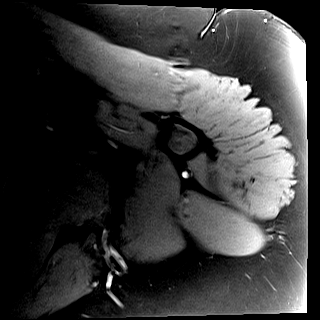
[im 4/20]
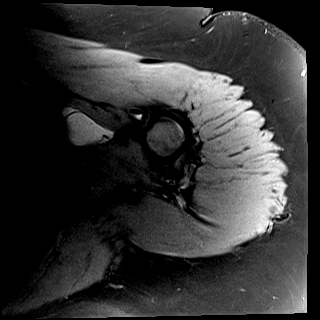
[im 8/20]
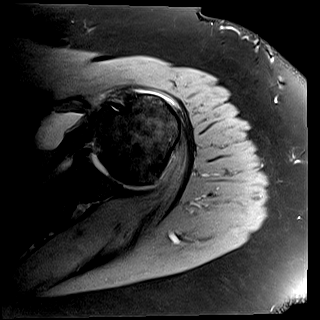
[im 12/20]
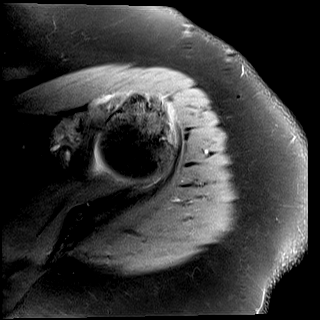
[im 16/20]
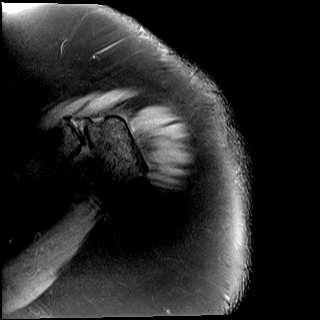
[im 20/20]
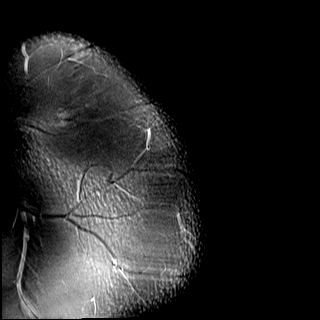

[Series 7: T2 fat-sat · oblique · left · 4.0mm · 0.22mm/px · 3 of 21 slices shown (1 of 2)]
[im 3/21]
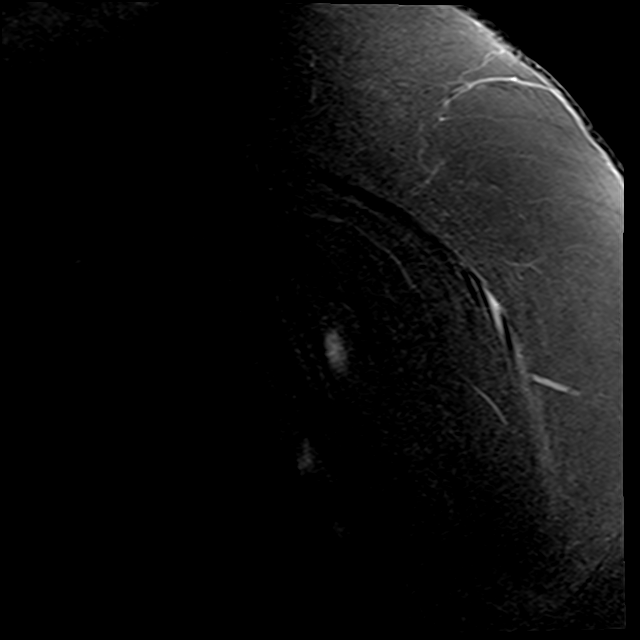
[im 12/21]
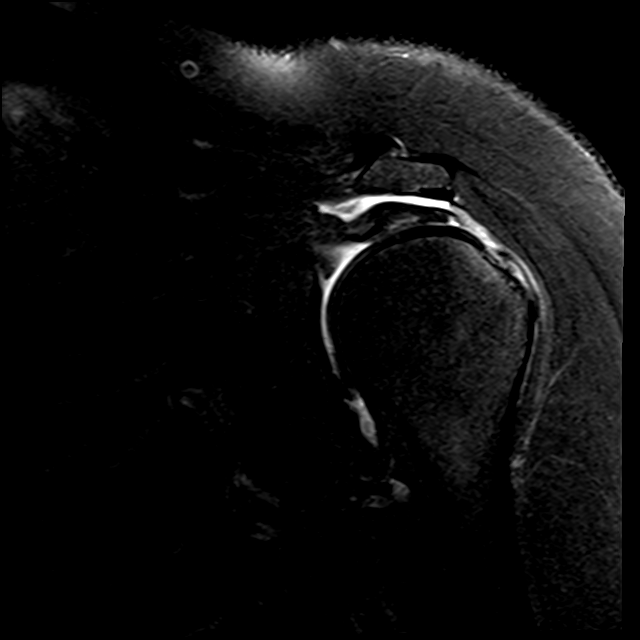
[im 18/21]
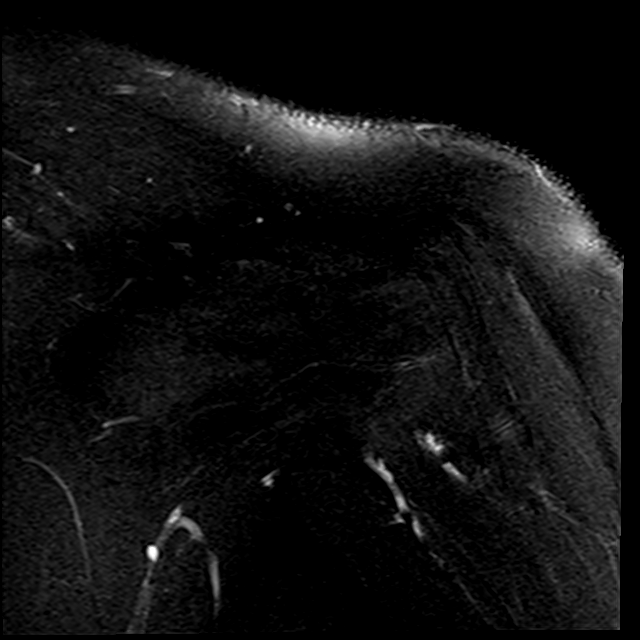

[Series 8: PD fat-sat · oblique · left · 4.0mm · 0.22mm/px · 7 of 21 slices shown (2 of 2)]
[im 1/21]
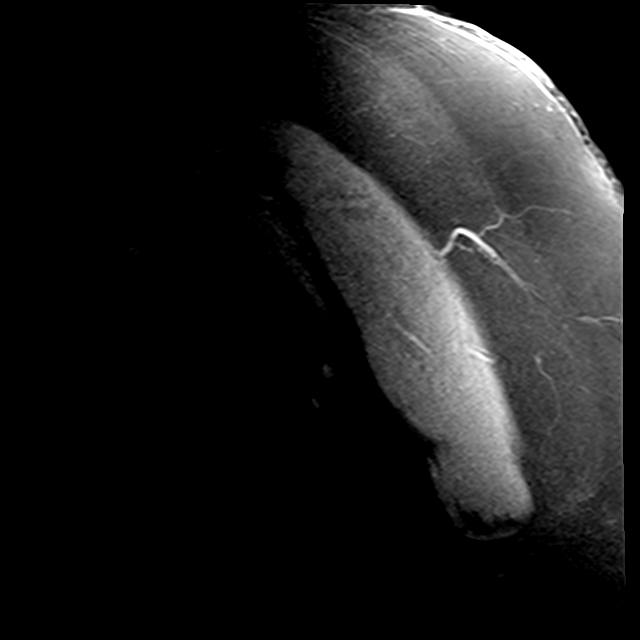
[im 3/21]
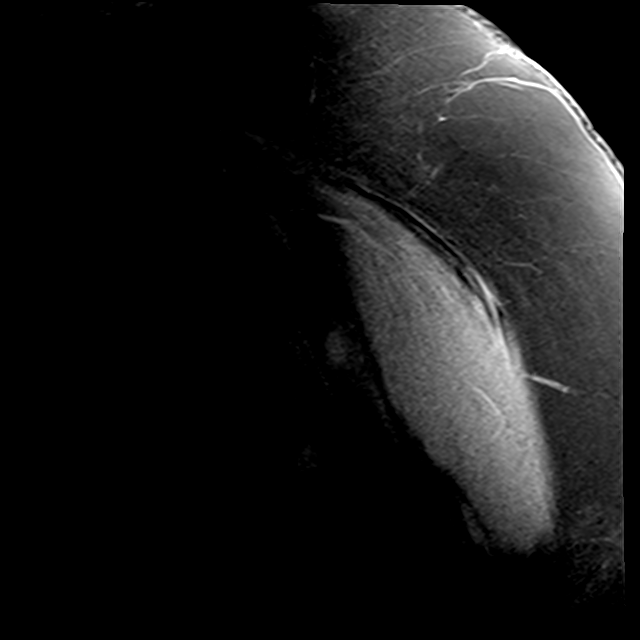
[im 6/21]
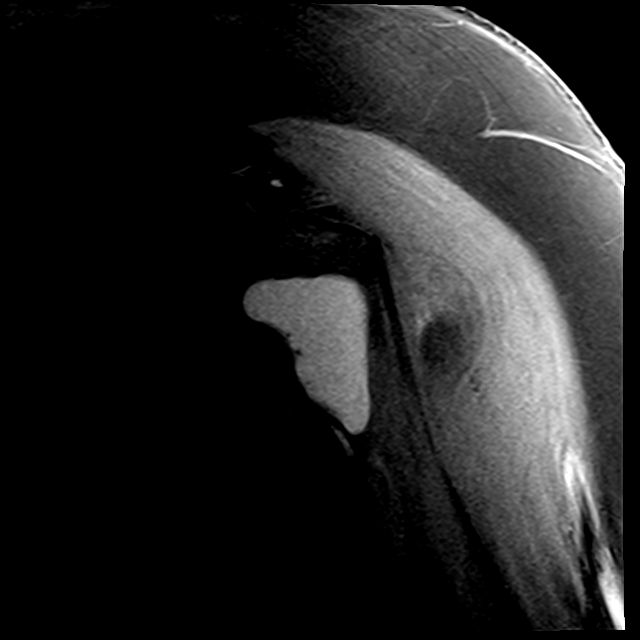
[im 9/21]
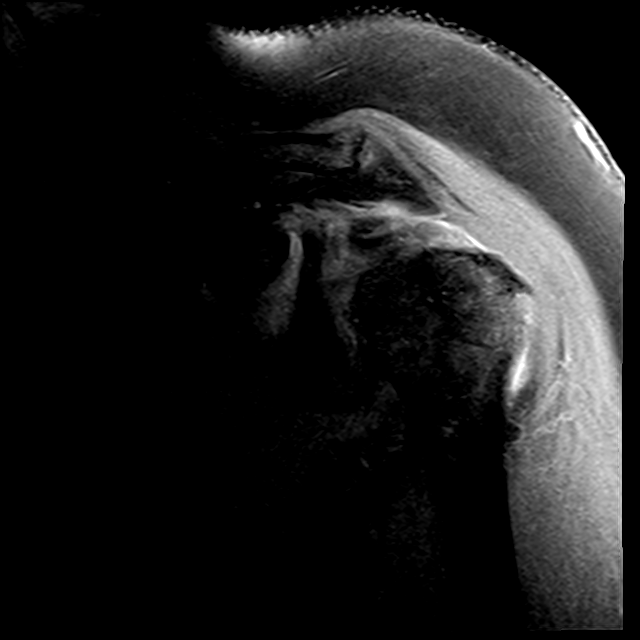
[im 12/21]
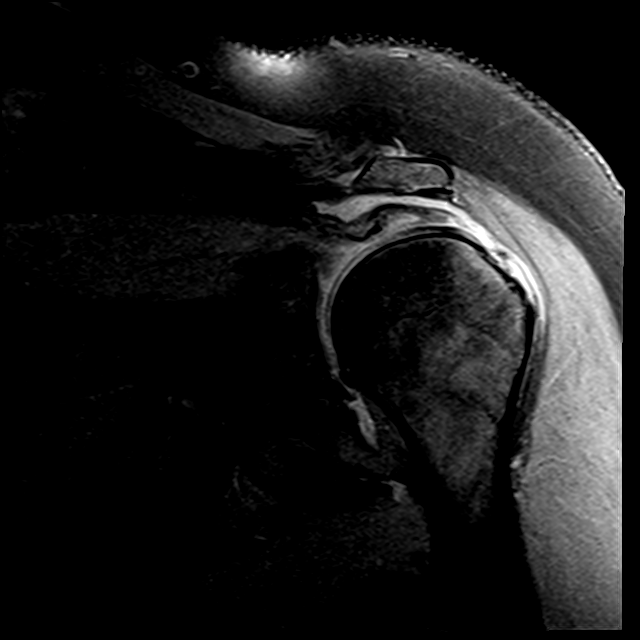
[im 15/21]
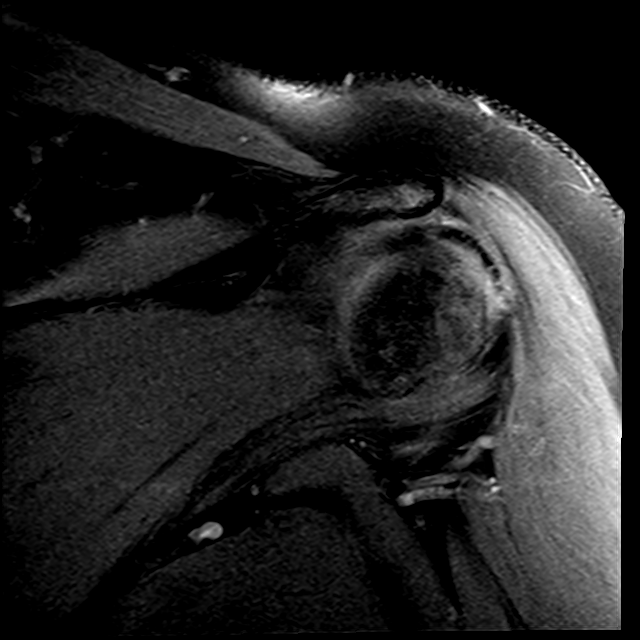
[im 18/21]
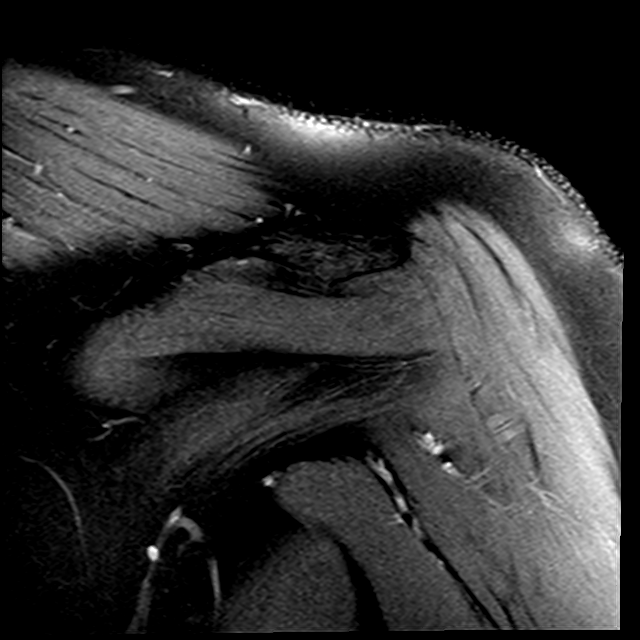

[Series 9: T2 fat-sat · oblique · left · 4.0mm · 0.44mm/px · 3 of 23 slices shown (2 of 2)]
[im 3/23]
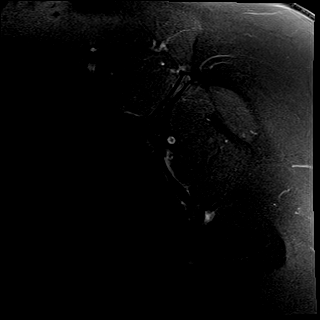
[im 12/23]
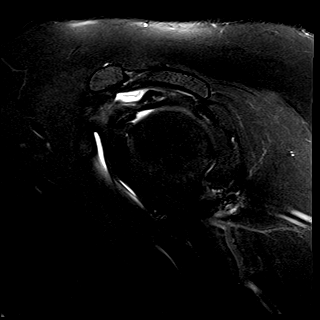
[im 20/23]
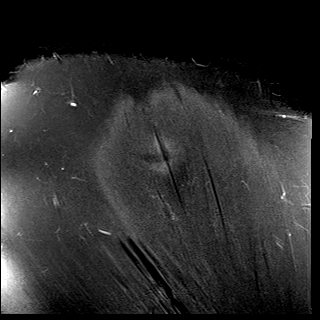

[19 of 40 positions shown; findings below may reference images not displayed]

FINDINGS: Rotator cuff: Complete tear of the supraspinatus tendon with 14 mm
of retraction. Moderate tendinosis of the infraspinatus tendon with
a tiny interstitial tear of the musculotendinous junction. Mild
tendinosis of the subscapularis tendon. Intact teres minor tendon.

Muscles: Fatty atrophy of teres minor muscle as can be seen with
quadrilateral space syndrome. Remainder the rotator cuff muscles are
normal.

Biceps long head: Mild tendinosis of the intra-articular portion of
the long head of the biceps tendon.

Acromioclavicular Joint: Mild arthropathy of the acromioclavicular
joint. Type II acromion. Moderate subacromial/subdeltoid bursal
fluid.

Glenohumeral Joint: No joint effusion. Mild partial-thickness
cartilage loss of the glenohumeral joint.

Labrum: Grossly intact, but evaluation is limited by lack of
intraarticular fluid.

Bones:  No acute osseous abnormality.  No aggressive osseous lesion.

Other: No fluid collection or hematoma.
IMPRESSION: 1. Complete tear of the supraspinatus tendon with 14 mm of
retraction.
2. Moderate tendinosis of the infraspinatus tendon with a tiny
interstitial tear of the musculotendinous junction.
3. Mild tendinosis of the subscapularis tendon. Intact teres minor
tendon.
4. Fatty atrophy of teres minor muscle as can be seen with
quadrilateral space syndrome.
5. Mild tendinosis of the intra-articular portion of the long head
of the biceps tendon.
6. Mild-moderate subacromial/subdeltoid bursitis.

## 2019-04-06 DIAGNOSIS — M25512 Pain in left shoulder: Secondary | ICD-10-CM | POA: Diagnosis not present

## 2019-04-06 DIAGNOSIS — M6281 Muscle weakness (generalized): Secondary | ICD-10-CM | POA: Diagnosis not present

## 2019-04-06 DIAGNOSIS — S43422D Sprain of left rotator cuff capsule, subsequent encounter: Secondary | ICD-10-CM | POA: Diagnosis not present

## 2019-04-06 DIAGNOSIS — M25612 Stiffness of left shoulder, not elsewhere classified: Secondary | ICD-10-CM | POA: Diagnosis not present

## 2019-04-13 DIAGNOSIS — S43422D Sprain of left rotator cuff capsule, subsequent encounter: Secondary | ICD-10-CM | POA: Diagnosis not present

## 2019-04-13 DIAGNOSIS — M6281 Muscle weakness (generalized): Secondary | ICD-10-CM | POA: Diagnosis not present

## 2019-04-13 DIAGNOSIS — M25512 Pain in left shoulder: Secondary | ICD-10-CM | POA: Diagnosis not present

## 2019-04-13 DIAGNOSIS — M25612 Stiffness of left shoulder, not elsewhere classified: Secondary | ICD-10-CM | POA: Diagnosis not present

## 2019-04-20 DIAGNOSIS — M25512 Pain in left shoulder: Secondary | ICD-10-CM | POA: Diagnosis not present

## 2019-04-20 DIAGNOSIS — S43422D Sprain of left rotator cuff capsule, subsequent encounter: Secondary | ICD-10-CM | POA: Diagnosis not present

## 2019-04-20 DIAGNOSIS — M25612 Stiffness of left shoulder, not elsewhere classified: Secondary | ICD-10-CM | POA: Diagnosis not present

## 2019-04-20 DIAGNOSIS — M6281 Muscle weakness (generalized): Secondary | ICD-10-CM | POA: Diagnosis not present

## 2019-04-27 DIAGNOSIS — M25612 Stiffness of left shoulder, not elsewhere classified: Secondary | ICD-10-CM | POA: Diagnosis not present

## 2019-04-27 DIAGNOSIS — S43422D Sprain of left rotator cuff capsule, subsequent encounter: Secondary | ICD-10-CM | POA: Diagnosis not present

## 2019-04-27 DIAGNOSIS — M6281 Muscle weakness (generalized): Secondary | ICD-10-CM | POA: Diagnosis not present

## 2019-04-27 DIAGNOSIS — M25512 Pain in left shoulder: Secondary | ICD-10-CM | POA: Diagnosis not present

## 2019-07-29 ENCOUNTER — Other Ambulatory Visit: Payer: Self-pay | Admitting: Obstetrics & Gynecology

## 2019-07-29 DIAGNOSIS — Z1231 Encounter for screening mammogram for malignant neoplasm of breast: Secondary | ICD-10-CM

## 2019-08-12 DIAGNOSIS — Z0001 Encounter for general adult medical examination with abnormal findings: Secondary | ICD-10-CM | POA: Diagnosis not present

## 2019-08-12 DIAGNOSIS — I10 Essential (primary) hypertension: Secondary | ICD-10-CM | POA: Diagnosis not present

## 2019-08-12 DIAGNOSIS — E785 Hyperlipidemia, unspecified: Secondary | ICD-10-CM | POA: Diagnosis not present

## 2019-08-12 DIAGNOSIS — E039 Hypothyroidism, unspecified: Secondary | ICD-10-CM | POA: Diagnosis not present

## 2019-08-12 DIAGNOSIS — E119 Type 2 diabetes mellitus without complications: Secondary | ICD-10-CM | POA: Diagnosis not present

## 2019-08-12 DIAGNOSIS — Z23 Encounter for immunization: Secondary | ICD-10-CM | POA: Diagnosis not present

## 2019-08-12 DIAGNOSIS — Z6841 Body Mass Index (BMI) 40.0 and over, adult: Secondary | ICD-10-CM | POA: Diagnosis not present

## 2019-09-15 ENCOUNTER — Ambulatory Visit
Admission: RE | Admit: 2019-09-15 | Discharge: 2019-09-15 | Disposition: A | Discharge status: Home / Self Care | Payer: BC Managed Care – PPO | Source: Ambulatory Visit | Attending: Obstetrics & Gynecology | Admitting: Obstetrics & Gynecology

## 2019-09-15 ENCOUNTER — Other Ambulatory Visit: Payer: Self-pay

## 2019-09-15 DIAGNOSIS — Z1231 Encounter for screening mammogram for malignant neoplasm of breast: Secondary | ICD-10-CM

## 2020-03-21 ENCOUNTER — Other Ambulatory Visit: Payer: Self-pay

## 2020-03-22 ENCOUNTER — Ambulatory Visit (INDEPENDENT_AMBULATORY_CARE_PROVIDER_SITE_OTHER): Payer: 59 | Admitting: Obstetrics & Gynecology

## 2020-03-22 ENCOUNTER — Encounter: Payer: Self-pay | Admitting: Obstetrics & Gynecology

## 2020-03-22 VITALS — BP 134/90 | Ht 63.5 in | Wt 317.0 lb

## 2020-03-22 DIAGNOSIS — Z3041 Encounter for surveillance of contraceptive pills: Secondary | ICD-10-CM | POA: Diagnosis not present

## 2020-03-22 DIAGNOSIS — Z01419 Encounter for gynecological examination (general) (routine) without abnormal findings: Secondary | ICD-10-CM

## 2020-03-22 MED ORDER — NORETHINDRONE 0.35 MG PO TABS: ORAL_TABLET | ORAL | 4 refills | Status: DC

## 2020-03-22 NOTE — Patient Instructions (Signed)
  1. Well female exam with routine gynecological exam Normal gynecologic exam, limited by obesity.  Pap test in January 2019 was negative, will repeat at 3 years.  Breast exam normal.  Screening mammogram November 2020 was negative.  Health labs with family physician.  2. Encounter for surveillance of contraceptive pills Well on the progestin only birth control pill.  No contraindication to continue.  Represcribed.  3. Obesity, morbid, BMI 50 or higher (HCC) Morbid obesity with a body mass index at 55.27.  Recommend a lower calorie/carb diet such as [employer].  Aerobic activities 5 times a week and light weightlifting every 2 days.  Other orders - cholecalciferol (VITAMIN D3) 25 MCG (1000 UNIT) tablet; Take 1,000 Units by mouth daily. - Zinc 100 MG TABS; Take by mouth. - norethindrone (MICRONOR) 0.35 MG tablet; TAKE ONE (1) TABLET BY MOUTH EVERY DAY  Mariah Baker, it was a pleasure seeing you today!

## 2020-03-22 NOTE — Progress Notes (Signed)
Mariah Baker [facility] 25-Sep-1971 [id number]   History:    49 y.o. G1P1L1 Married.  Daughter is 60 yo.  RP:  Established patient presenting for annual gyn exam   HPI: Well on the Progestin-only BCP.  No BTB.  No pelvic pain.  No pain with intercourse.  Normal vaginal secretions.  Urine and bowel movements normal.  Breast normal.  Body mass index 55.27.  Walking.  Health labs with family physician.   Past medical history,surgical history, family history and social history were all reviewed and documented in the EPIC chart.  Gynecologic History No LMP recorded. (Menstrual status: Oral contraceptives).  Obstetric History OB History  Gravida Para Term Preterm AB Living  1 1       1   SAB TAB Ectopic Multiple Live Births               # Outcome Date GA Lbr Len/2nd Weight Sex Delivery Anes PTL Lv  1 Para              ROS: A ROS was performed and pertinent positives and negatives are included in the history.  GENERAL: No fevers or chills. HEENT: No change in vision, no earache, sore throat or sinus congestion. NECK: No pain or stiffness. CARDIOVASCULAR: No chest pain or pressure. No palpitations. PULMONARY: No shortness of breath, cough or wheeze. GASTROINTESTINAL: No abdominal pain, nausea, vomiting or diarrhea, melena or bright red blood per rectum. GENITOURINARY: No urinary frequency, urgency, hesitancy or dysuria. MUSCULOSKELETAL: No joint or muscle pain, no back pain, no recent trauma. DERMATOLOGIC: No rash, no itching, no lesions. ENDOCRINE: No polyuria, polydipsia, no heat or cold intolerance. No recent change in weight. HEMATOLOGICAL: No anemia or easy bruising or bleeding. NEUROLOGIC: No headache, seizures, numbness, tingling or weakness. PSYCHIATRIC: No depression, no loss of interest in normal activity or change in sleep pattern.     Exam:   BP 134/90   Ht 5' 3.5" (1.613 m)   Wt (!) 317 lb (143.8 kg)   BMI 55.27 kg/m   Body mass index is 55.27 kg/m.  General appearance :  Well developed well nourished female. No acute distress HEENT: Eyes: no retinal hemorrhage or exudates,  Neck supple, trachea midline, no carotid bruits, no thyroidmegaly Lungs: Clear to auscultation, no rhonchi or wheezes, or rib retractions  Heart: Regular rate and rhythm, no murmurs or gallops Breast:Examined in sitting and supine position were symmetrical in appearance, no palpable masses or tenderness,  no skin retraction, no nipple inversion, no nipple discharge, no skin discoloration, no axillary or supraclavicular lymphadenopathy Abdomen: no palpable masses or tenderness, no rebound or guarding Extremities: no edema or skin discoloration or tenderness  Pelvic: Vulva: Normal             Vagina: No gross lesions or discharge  Cervix: No gross lesions or discharge  Uterus  AV, normal size, shape and consistency, non-tender and mobile  Adnexa  Without masses or tenderness  Anus: Normal   Assessment/Plan:  49 y.o. female for annual exam   1. Well female exam with routine gynecological exam Normal gynecologic exam, limited by obesity.  Pap test in January 2019 was negative, will repeat at 3 years.  Breast exam normal.  Screening mammogram November 2020 was negative.  Health labs with family physician.  2. Encounter for surveillance of contraceptive pills Well on the progestin only birth control pill.  No contraindication to continue.  Represcribed.  3. Obesity, morbid, BMI 50 or higher (HCC) Morbid obesity  with a body mass index at 55.27.  Recommend a lower calorie/carb diet such as [employer].  Aerobic activities 5 times a week and light weightlifting every 2 days.  Other orders - cholecalciferol (VITAMIN D3) 25 MCG (1000 UNIT) tablet; Take 1,000 Units by mouth daily. - Zinc 100 MG TABS; Take by mouth. - norethindrone (MICRONOR) 0.35 MG tablet; TAKE ONE (1) TABLET BY MOUTH EVERY DAY  Princess Bruins MD, 4:07 PM 03/22/2020

## 2020-11-01 ENCOUNTER — Other Ambulatory Visit: Payer: Self-pay | Admitting: Obstetrics & Gynecology

## 2020-11-01 DIAGNOSIS — Z1231 Encounter for screening mammogram for malignant neoplasm of breast: Secondary | ICD-10-CM

## 2020-12-12 ENCOUNTER — Ambulatory Visit: Payer: BC Managed Care – PPO

## 2021-01-30 ENCOUNTER — Other Ambulatory Visit: Payer: Self-pay

## 2021-01-30 ENCOUNTER — Ambulatory Visit
Admission: RE | Admit: 2021-01-30 | Discharge: 2021-01-30 | Disposition: A | Discharge status: Home / Self Care | Payer: 59 | Source: Ambulatory Visit | Attending: Obstetrics & Gynecology | Admitting: Obstetrics & Gynecology

## 2021-01-30 DIAGNOSIS — Z1231 Encounter for screening mammogram for malignant neoplasm of breast: Secondary | ICD-10-CM

## 2021-06-01 ENCOUNTER — Encounter: Payer: Self-pay | Admitting: Obstetrics & Gynecology

## 2021-06-01 ENCOUNTER — Other Ambulatory Visit (HOSPITAL_COMMUNITY)
Admission: RE | Admit: 2021-06-01 | Discharge: 2021-06-01 | Disposition: A | Discharge status: Home / Self Care | Payer: 59 | Source: Ambulatory Visit | Attending: Obstetrics & Gynecology | Admitting: Obstetrics & Gynecology

## 2021-06-01 ENCOUNTER — Ambulatory Visit (INDEPENDENT_AMBULATORY_CARE_PROVIDER_SITE_OTHER): Payer: 59 | Admitting: Obstetrics & Gynecology

## 2021-06-01 ENCOUNTER — Other Ambulatory Visit: Payer: Self-pay

## 2021-06-01 VITALS — BP 118/76 | HR 74 | Resp 16 | Ht 63.75 in | Wt 318.0 lb

## 2021-06-01 DIAGNOSIS — N951 Menopausal and female climacteric states: Secondary | ICD-10-CM | POA: Diagnosis not present

## 2021-06-01 DIAGNOSIS — Z01419 Encounter for gynecological examination (general) (routine) without abnormal findings: Secondary | ICD-10-CM | POA: Diagnosis not present

## 2021-06-01 NOTE — Progress Notes (Signed)
Mariah Baker [facility] Jul 24, 1971 [id number]   History:    50 y.o.  G1P1L1 Married.  Daughter is 20 yo, IT consultant.   RP:  Established patient presenting for annual gyn exam   HPI: Well on the Progestin-only BCP, no menses.  No BTB.  Menopausal Sxs with hot flushes, night sweats.  No pelvic pain.  No pain with intercourse.  Normal vaginal secretions.  Urine and bowel movements normal.  Breast normal.  Body mass index 55.27.  Health labs with family physician.  Planning a Rt hip replacement in the Fall.   Past medical history,surgical history, family history and social history were all reviewed and documented in the EPIC chart.  Gynecologic History No LMP recorded. (Menstrual status: Oral contraceptives).  Obstetric History OB History  Gravida Para Term Preterm AB Living  1 1       1   SAB IAB Ectopic Multiple Live Births               # Outcome Date GA Lbr Len/2nd Weight Sex Delivery Anes PTL Lv  1 Para              ROS: A ROS was performed and pertinent positives and negatives are included in the history.  GENERAL: No fevers or chills. HEENT: No change in vision, no earache, sore throat or sinus congestion. NECK: No pain or stiffness. CARDIOVASCULAR: No chest pain or pressure. No palpitations. PULMONARY: No shortness of breath, cough or wheeze. GASTROINTESTINAL: No abdominal pain, nausea, vomiting or diarrhea, melena or bright red blood per rectum. GENITOURINARY: No urinary frequency, urgency, hesitancy or dysuria. MUSCULOSKELETAL: No joint or muscle pain, no back pain, no recent trauma. DERMATOLOGIC: No rash, no itching, no lesions. ENDOCRINE: No polyuria, polydipsia, no heat or cold intolerance. No recent change in weight. HEMATOLOGICAL: No anemia or easy bruising or bleeding. NEUROLOGIC: No headache, seizures, numbness, tingling or weakness. PSYCHIATRIC: No depression, no loss of interest in normal activity or change in sleep pattern.     Exam:   BP 118/76   Pulse 74   Resp 16   Ht 5'  3.75" (1.619 m)   Wt (!) 318 lb (144.2 kg)   BMI 55.01 kg/m   Body mass index is 55.01 kg/m.  General appearance : Well developed well nourished female. No acute distress HEENT: Eyes: no retinal hemorrhage or exudates,  Neck supple, trachea midline, no carotid bruits, no thyroidmegaly Lungs: Clear to auscultation, no rhonchi or wheezes, or rib retractions  Heart: Regular rate and rhythm, no murmurs or gallops Breast:Examined in sitting and supine position were symmetrical in appearance, no palpable masses or tenderness,  no skin retraction, no nipple inversion, no nipple discharge, no skin discoloration, no axillary or supraclavicular lymphadenopathy Abdomen: no palpable masses or tenderness, no rebound or guarding Extremities: no edema or skin discoloration or tenderness  Pelvic: Vulva: Normal             Vagina: No gross lesions or discharge  Cervix: No gross lesions or discharge.  Pap reflex done.  Uterus  AV, normal size, shape and consistency, non-tender and mobile  Adnexa  Without masses or tenderness  Anus: Normal   Assessment/Plan:  50 y.o. female for annual exam   1. Encounter for routine gynecological examination with Papanicolaou smear of cervix Normal gynecologic exam.  Pap reflex done.  Breast exam normal.  Screening mammogram March 2022 was negative.  Health labs with family physician. - Cytology - PAP( [place])  2. Perimenopause Will check  menopausal status today with an FSH.  We will decide if continues on the progestin pill based on the [facility]. - F[facility]  3. Obesity, morbid, BMI 50 or higher (HCC) Recommend a lower calorie/carb diet.  Intermittent fasting to consider.  Aerobic activities 5 times a week and light weightlifting every 2 days.  Other orders - hydrochlorothiazide (HYDRODIURIL) 25 MG tablet; Take 25 mg by mouth daily. - celecoxib (CELEBREX) 200 MG capsule; Take 200 mg by mouth daily as needed. - QUERCETIN PO; Take by mouth.   Mariah Del MD,  8:30 AM 06/01/2021

## 2021-06-02 LAB — CYTOLOGY - PAP
Adequacy: ABSENT
Diagnosis: NEGATIVE

## 2021-06-02 LAB — FOLLICLE STIMULATING HORMONE: FSH: 6.7 m[IU]/mL

## 2021-06-05 ENCOUNTER — Encounter: Payer: Self-pay | Admitting: Obstetrics & Gynecology

## 2021-06-20 ENCOUNTER — Other Ambulatory Visit: Payer: Self-pay | Admitting: Obstetrics & Gynecology

## 2021-07-05 ENCOUNTER — Other Ambulatory Visit: Payer: Self-pay | Admitting: Gastroenterology

## 2021-08-25 ENCOUNTER — Encounter (HOSPITAL_COMMUNITY): Payer: Self-pay | Admitting: Gastroenterology

## 2021-08-25 NOTE — Progress Notes (Signed)
Attempted to obtain medical history via telephone, unable to reach at this time. I left a voicemail to return pre surgical testing department's phone call.  

## 2021-09-04 NOTE — Anesthesia Preprocedure Evaluation (Addendum)
Anesthesia Evaluation  Patient identified by MRN, date of birth, ID band Patient awake    Reviewed: Allergy & Precautions, NPO status , Patient's Chart, lab work & pertinent test results  Airway Mallampati: III  TM Distance: >3 FB Neck ROM: Full  Mouth opening: Limited Mouth Opening  Dental no notable dental hx. (+) Teeth Intact, Dental Advisory Given   Pulmonary neg pulmonary ROS, former smoker,    Pulmonary exam normal breath sounds clear to auscultation       Cardiovascular hypertension, Pt. on medications Normal cardiovascular exam Rhythm:Regular Rate:Normal     Neuro/Psych negative neurological ROS  negative psych ROS   GI/Hepatic negative GI ROS, Neg liver ROS,   Endo/Other  diabetes, Type 2, Oral Hypoglycemic AgentsHypothyroidism Morbid obesity (BMI 55)  Renal/GU negative Renal ROS  negative genitourinary   Musculoskeletal  (+) Arthritis ,   Abdominal   Peds  Hematology negative hematology ROS (+)   Anesthesia Other Findings   Reproductive/Obstetrics                            Anesthesia Physical Anesthesia Plan  ASA: 3  Anesthesia Plan: MAC   Post-op Pain Management:    Induction: Intravenous  PONV Risk Score and Plan: Propofol infusion and Treatment may vary due to age or medical condition  Airway Management Planned: Natural Airway  Additional Equipment:   Intra-op Plan:   Post-operative Plan:   Informed Consent: I have reviewed the patients History and Physical, chart, labs and discussed the procedure including the risks, benefits and alternatives for the proposed anesthesia with the patient or authorized representative who has indicated his/her understanding and acceptance.     Dental advisory given  Plan Discussed with: CRNA  Anesthesia Plan Comments:         Anesthesia Quick Evaluation

## 2021-09-05 ENCOUNTER — Other Ambulatory Visit: Payer: Self-pay

## 2021-09-05 ENCOUNTER — Encounter (HOSPITAL_COMMUNITY)
Admission: RE | Disposition: A | Discharge status: Home / Self Care | Payer: Self-pay | Source: Home / Self Care | Attending: Gastroenterology

## 2021-09-05 ENCOUNTER — Ambulatory Visit (HOSPITAL_COMMUNITY): Payer: 59 | Admitting: Certified Registered Nurse Anesthetist

## 2021-09-05 ENCOUNTER — Ambulatory Visit (HOSPITAL_COMMUNITY)
Admission: RE | Admit: 2021-09-05 | Discharge: 2021-09-05 | Disposition: A | Discharge status: Home / Self Care | Payer: 59 | Attending: Gastroenterology | Admitting: Gastroenterology

## 2021-09-05 ENCOUNTER — Encounter (HOSPITAL_COMMUNITY): Payer: Self-pay | Admitting: Gastroenterology

## 2021-09-05 DIAGNOSIS — I10 Essential (primary) hypertension: Secondary | ICD-10-CM | POA: Insufficient documentation

## 2021-09-05 DIAGNOSIS — Z87891 Personal history of nicotine dependence: Secondary | ICD-10-CM | POA: Diagnosis not present

## 2021-09-05 DIAGNOSIS — E039 Hypothyroidism, unspecified: Secondary | ICD-10-CM | POA: Insufficient documentation

## 2021-09-05 DIAGNOSIS — E119 Type 2 diabetes mellitus without complications: Secondary | ICD-10-CM | POA: Diagnosis not present

## 2021-09-05 DIAGNOSIS — Z791 Long term (current) use of non-steroidal anti-inflammatories (NSAID): Secondary | ICD-10-CM | POA: Insufficient documentation

## 2021-09-05 DIAGNOSIS — M1990 Unspecified osteoarthritis, unspecified site: Secondary | ICD-10-CM | POA: Diagnosis not present

## 2021-09-05 DIAGNOSIS — Z6841 Body Mass Index (BMI) 40.0 and over, adult: Secondary | ICD-10-CM | POA: Insufficient documentation

## 2021-09-05 DIAGNOSIS — K621 Rectal polyp: Secondary | ICD-10-CM | POA: Diagnosis not present

## 2021-09-05 DIAGNOSIS — Z1211 Encounter for screening for malignant neoplasm of colon: Secondary | ICD-10-CM | POA: Diagnosis present

## 2021-09-05 DIAGNOSIS — Z79890 Hormone replacement therapy (postmenopausal): Secondary | ICD-10-CM | POA: Diagnosis not present

## 2021-09-05 DIAGNOSIS — Z79899 Other long term (current) drug therapy: Secondary | ICD-10-CM | POA: Insufficient documentation

## 2021-09-05 DIAGNOSIS — K648 Other hemorrhoids: Secondary | ICD-10-CM | POA: Insufficient documentation

## 2021-09-05 DIAGNOSIS — Z7984 Long term (current) use of oral hypoglycemic drugs: Secondary | ICD-10-CM | POA: Insufficient documentation

## 2021-09-05 HISTORY — PX: COLONOSCOPY WITH PROPOFOL: SHX5780

## 2021-09-05 HISTORY — PX: POLYPECTOMY: SHX5525

## 2021-09-05 SURGERY — COLONOSCOPY WITH PROPOFOL
Anesthesia: Monitor Anesthesia Care

## 2021-09-05 MED ORDER — SODIUM CHLORIDE 0.9 % IV SOLN: INTRAVENOUS | Status: DC

## 2021-09-05 MED ORDER — LACTATED RINGERS IV SOLN: INTRAVENOUS | Status: DC

## 2021-09-05 MED ORDER — PROPOFOL 500 MG/50ML IV EMUL
INTRAVENOUS | Status: DC | PRN
  Administered 2021-09-05: 125 ug/kg/min via INTRAVENOUS

## 2021-09-05 MED ORDER — PROPOFOL 10 MG/ML IV BOLUS
INTRAVENOUS | Status: DC | PRN
  Administered 2021-09-05: 20 mg via INTRAVENOUS

## 2021-09-05 MED ORDER — PROPOFOL 500 MG/50ML IV EMUL
INTRAVENOUS | Status: AC
  Filled 2021-09-05: qty 150

## 2021-09-05 SURGICAL SUPPLY — 22 items

## 2021-09-05 NOTE — H&P (Signed)
Primary Care Physician:  Assunta Found, MD Primary Gastroenterologist:  Dr. Levora Angel  Reason for Visit : Screening colonoscopy  HPI: Mariah Baker is a 50 y.o. female here for outpatient screening colonoscopy.Patient with past medical history of morbid obesity with BMI of 53, history of prediabetes and hypertension.        She denies any GI symptoms.        Blood work on April 06, 2021 showed normal CBC, CMP and TSH.  Past Medical History:  Diagnosis Date   Arthritis    in hip   Complication of anesthesia    takes a little bit longer to wake up   Diabetes mellitus    Type 2   Hypertension    Hypothyroidism    "running high on my thyroid tests on low dose synthroid    Past Surgical History:  Procedure Laterality Date   ARTHOSCOPIC ROTAOR CUFF REPAIR Left 09/22/2018   Procedure: ARTHROSCOPIC ROTATOR CUFF REPAIR;  Surgeon: Bjorn Pippin, MD;  Location: [facility] OR;  Service: Orthopedics;  Laterality: Left;   BICEPT TENODESIS Left 09/22/2018   Procedure: BICEPS TENODESIS;  Surgeon: Bjorn Pippin, MD;  Location: [facility] OR;  Service: Orthopedics;  Laterality: Left;   KNEE ARTHROSCOPY     SHOULDER ACROMIOPLASTY Left 09/22/2018   Procedure: SHOULDER ACROMIOPLASTY;  Surgeon: Bjorn Pippin, MD;  Location: [facility] OR;  Service: Orthopedics;  Laterality: Left;   SHOULDER ARTHROSCOPY Left 09/22/2018   Procedure: ARTHROSCOPY SHOULDER WITH CLAVICALECTOMY;  Surgeon: Bjorn Pippin, MD;  Location: [facility] OR;  Service: Orthopedics;  Laterality: Left;   TONSILECTOMY, ADENOIDECTOMY, BILATERAL MYRINGOTOMY AND TUBES      Prior to Admission medications   Medication Sig Start Date End Date Taking? Authorizing Provider  celecoxib (CELEBREX) 200 MG capsule Take 200 mg by mouth 2 (two) times daily. 05/09/21  Yes [provider]  cholecalciferol (VITAMIN D3) 25 MCG (1000 UNIT) tablet Take 1,000 Units by mouth daily.   Yes [provider]  hydrochlorothiazide (HYDRODIURIL) 25 MG tablet Take 25 mg by mouth  daily. 05/18/21  Yes [provider]  levothyroxine (SYNTHROID, LEVOTHROID) 50 MCG tablet Take 50 mcg by mouth daily before breakfast.   Yes [provider]  losartan (COZAAR) 100 MG tablet Take 100 mg by mouth daily.   Yes [provider]  Menaquinone-7 (K2 PO) Take 1 tablet by mouth daily.   Yes [provider]  metFORMIN (GLUCOPHAGE) 500 MG tablet Take 500 mg by mouth 2 (two) times daily with a meal.   Yes [provider]  norethindrone (HEATHER) 0.35 MG tablet TAKE ONE (1) TABLET BY MOUTH EVERY DAY 06/20/21  Yes Genia Del, MD  Omega-3 Fatty Acids (FISH OIL) 1000 MG CAPS Take 1,000 mg by mouth daily.   Yes [provider]  QUERCETIN PO Take 1 capsule by mouth daily.   Yes [provider]  topiramate (TOPAMAX) 100 MG tablet Take 50 mg by mouth at bedtime.    Yes [provider]  Zinc 50 MG CAPS Take 50 mg by mouth daily.   Yes [provider]    Scheduled Meds: Continuous Infusions:  sodium chloride     lactated ringers 20 mL/hr at 09/05/21 0917   PRN Meds:.  Allergies as of 07/05/2021   (No Known Allergies)    Family History  Adopted: Yes  Family history unknown: Yes    Social History   Socioeconomic History   Marital status: Married    Spouse name: Not  on file   Number of children: Not on file   Years of education: Not on file   Highest education level: Not on file  Occupational History   Not on file  Tobacco Use   Smoking status: Former    Types: Cigarettes    Quit date: 12/12/2003    Years since quitting: 17.7   Smokeless tobacco: Never  Vaping Use   Vaping Use: Never used  Substance and Sexual Activity   Alcohol use: Not Currently   Drug use: No   Sexual activity: Yes    Partners: Male    Birth control/protection: Pill    Comment: 1st intercourse- 63, partners- 10, married- 16 yrs  Other Topics Concern   Not on file  Social History Narrative   Not on file   Social  Determinants of Health   Financial Resource Strain: Not on file  Food Insecurity: Not on file  Transportation Needs: Not on file  Physical Activity: Not on file  Stress: Not on file  Social Connections: Not on file  Intimate Partner Violence: Not on file      Physical Exam: Vital signs: Vitals:   09/05/21 0858  BP: (!) 165/94  Pulse: 78  Resp: 20  Temp: 98.2 F (36.8 C)  SpO2: 100%     General:   Alert,  Well-developed, well-nourished, pleasant and cooperative in NAD Lungs:  Clear throughout to auscultation.   No wheezes, crackles, or rhonchi. No acute distress. Heart:  Regular rate and rhythm; no murmurs, clicks, rubs,  or gallops. Abdomen: Soft, nontender, nondistended, bowel sounds present.  No peritoneal sign Rectal:  Deferred  GI:  Lab Results: No results for input(s): WBC, HGB, HCT, PLT in the last 72 hours. BMET No results for input(s): NA, K, CL, CO2, GLUCOSE, BUN, CREATININE, CALCIUM in the last 72 hours. LFT No results for input(s): PROT, ALBUMIN, AST, ALT, ALKPHOS, BILITOT, BILIDIR, IBILI in the last 72 hours. PT/INR No results for input(s): LABPROT, INR in the last 72 hours.   Studies/Results: No results found.  Impression/Plan: -Colon cancer screening -Morbid obesity  Recommendation ----------------------- -Proceed with colonoscopy today.  Risks (bleeding, infection, bowel perforation that could require surgery, sedation-related changes in cardiopulmonary systems), benefits (identification and possible treatment of source of symptoms, exclusion of certain causes of symptoms), and alternatives (watchful waiting, radiographic imaging studies, empiric medical treatment)  were explained to patient/family in detail and patient wishes to proceed.     LOS: 0 days   Kathi Der  MD, FACP 09/05/2021, 9:37 AM  Contact #  [phone]

## 2021-09-05 NOTE — Op Note (Signed)
[facility] Patient Name: Mariah Baker Procedure Date: 09/05/2021 MRN: [id number] Attending MD: Kathi Der , MD Date of Birth: Mar 06, 1971 CSN: [id number] Age: 50 Admit Type: Outpatient Procedure:                Colonoscopy Indications:              Screening for colorectal malignant neoplasm, This                            is the patient's first colonoscopy Providers:                Kathi Der, MD, Lenny Pastel , Salley Scarlet, Technician, Kym Groom, CRNA Referring MD:              Medicines:                Sedation Administered by an Anesthesia Professional Complications:            No immediate complications. Estimated Blood Loss:     Estimated blood loss was minimal. Procedure:                Pre-Anesthesia Assessment:                           - Prior to the procedure, a History and Physical                            was performed, and patient medications and                            allergies were reviewed. The patient's tolerance of                            previous anesthesia was also reviewed. The risks                            and benefits of the procedure and the sedation                            options and risks were discussed with the patient.                            All questions were answered, and informed consent                            was obtained. Prior Anticoagulants: The patient has                            taken no previous anticoagulant or antiplatelet                            agents. ASA Grade Assessment: III - A patient with  severe systemic disease. After reviewing the risks                            and benefits, the patient was deemed in                            satisfactory condition to undergo the procedure.                           After obtaining informed consent, the colonoscope                            was passed under direct vision.  Throughout the                            procedure, the patient's blood pressure, pulse, and                            oxygen saturations were monitored continuously. The                            PCF-HQ190L ([id number]) Olympus colonoscope was                            introduced through the anus and advanced to the the                            cecum, identified by appendiceal orifice and                            ileocecal valve. The colonoscopy was performed                            without difficulty. The patient tolerated the                            procedure well. The quality of the bowel                            preparation was good. Scope In: 10:07:37 AM Scope Out: 10:26:47 AM Scope Withdrawal Time: 0 hours 12 minutes 58 seconds  Total Procedure Duration: 0 hours 19 minutes 10 seconds  Findings:      The perianal and digital rectal examinations were normal.      A 6 mm polyp was found in the sigmoid colon. The polyp was sessile. The       polyp was removed with a cold snare. Resection and retrieval were       complete.      A 7 mm polyp was found in the rectum. The polyp was sessile. The polyp       was removed with a cold snare. Resection and retrieval were complete.      Internal hemorrhoids were found during retroflexion. The hemorrhoids       were medium-sized. Impression:               - One  6 mm polyp in the sigmoid colon, removed with                            a cold snare. Resected and retrieved.                           - One 7 mm polyp in the rectum, removed with a cold                            snare. Resected and retrieved.                           - Internal hemorrhoids. Moderate Sedation:      Moderate (conscious) sedation was personally administered by an       anesthesia professional. The following parameters were monitored: oxygen       saturation, heart rate, blood pressure, and response to care. Recommendation:           - Patient has a  contact number available for                            emergencies. The signs and symptoms of potential                            delayed complications were discussed with the                            patient. Return to normal activities tomorrow.                            Written discharge instructions were provided to the                            patient.                           - Resume previous diet.                           - Continue present medications.                           - Await pathology results.                           - Repeat colonoscopy in 5-10 years for surveillance                            based on pathology results.                           - Return to my office PRN. Procedure Code(s):        --- Professional ---                           [id number], Colonoscopy, flexible; with removal of  tumor(s), polyp(s), or other lesion(s) by snare                            technique Diagnosis Code(s):        --- Professional ---                           Z12.11, Encounter for screening for malignant                            neoplasm of colon                           K63.5, Polyp of colon                           K62.1, Rectal polyp                           K64.8, Other hemorrhoids CPT copyright 2019 [employer]. All rights reserved. The codes documented in this report are preliminary and upon coder review may  be revised to meet current compliance requirements. Kathi Der, MD Kathi Der, MD 09/05/2021 10:33:09 AM Number of Addenda: 0

## 2021-09-05 NOTE — Discharge Instructions (Signed)
YOU HAD AN ENDOSCOPIC PROCEDURE TODAY: Refer to the procedure report and other information in the discharge instructions given to you for any specific questions about what was found during the examination. If this information does not answer your questions, please call [facility] office at [phone] to clarify.   YOU SHOULD EXPECT: Some feelings of bloating in the abdomen. Passage of more gas than usual. Walking can help get rid of the air that was put into your GI tract during the procedure and reduce the bloating. If you had a lower endoscopy (such as a colonoscopy or flexible sigmoidoscopy) you may notice spotting of blood in your stool or on the toilet paper. Some abdominal soreness may be present for a day or two, also.  DIET: Your first meal following the procedure should be a light meal and then it is ok to progress to your normal diet. A half-sandwich or bowl of soup is an example of a good first meal. Heavy or fried foods are harder to digest and may make you feel nauseous or bloated. Drink plenty of fluids but you should avoid alcoholic beverages for 24 hours. If you had a esophageal dilation, please see attached instructions for diet.    ACTIVITY: Your care partner should take you home directly after the procedure. You should plan to take it easy, moving slowly for the rest of the day. You can resume normal activity the day after the procedure however YOU SHOULD NOT DRIVE, use power tools, machinery or perform tasks that involve climbing or major physical exertion for 24 hours (because of the sedation medicines used during the test).   SYMPTOMS TO REPORT IMMEDIATELY: A gastroenterologist can be reached at any hour. Please call [phone]  for any of the following symptoms:  Following lower endoscopy (colonoscopy, flexible sigmoidoscopy) Excessive amounts of blood in the stool  Significant tenderness, worsening of abdominal pains  Swelling of the abdomen that is new, acute  Fever of 100  or higher  Following upper endoscopy (EGD, EUS, ERCP, esophageal dilation) Vomiting of blood or coffee ground material  New, significant abdominal pain  New, significant chest pain or pain under the shoulder blades  Painful or persistently difficult swallowing  New shortness of breath  Black, tarry-looking or red, bloody stools  FOLLOW UP:  If any biopsies were taken you will be contacted by phone or by letter within the next 1-3 weeks. Call [phone]  if you have not heard about the biopsies in 3 weeks.  Please also call with any specific questions about appointments or follow up tests. YOU HAD AN ENDOSCOPIC PROCEDURE TODAY: Refer to the procedure report and other information in the discharge instructions given to you for any specific questions about what was found during the examination. If this information does not answer your questions, please call [facility] office at [phone] to clarify.   YOU SHOULD EXPECT: Some feelings of bloating in the abdomen. Passage of more gas than usual. Walking can help get rid of the air that was put into your GI tract during the procedure and reduce the bloating. If you had a lower endoscopy (such as a colonoscopy or flexible sigmoidoscopy) you may notice spotting of blood in your stool or on the toilet paper. Some abdominal soreness may be present for a day or two, also.  DIET: Your first meal following the procedure should be a light meal and then it is ok to progress to your normal diet. A half-sandwich or bowl of soup is an   example of a good first meal. Heavy or fried foods are harder to digest and may make you feel nauseous or bloated. Drink plenty of fluids but you should avoid alcoholic beverages for 24 hours. If you had a esophageal dilation, please see attached instructions for diet.    ACTIVITY: Your care partner should take you home directly after the procedure. You should plan to take it easy, moving slowly for the rest of the day. You can resume  normal activity the day after the procedure however YOU SHOULD NOT DRIVE, use power tools, machinery or perform tasks that involve climbing or major physical exertion for 24 hours (because of the sedation medicines used during the test).   SYMPTOMS TO REPORT IMMEDIATELY: A gastroenterologist can be reached at any hour. Please call [phone]  for any of the following symptoms:  Following lower endoscopy (colonoscopy, flexible sigmoidoscopy) Excessive amounts of blood in the stool  Significant tenderness, worsening of abdominal pains  Swelling of the abdomen that is new, acute  Fever of 100 or higher    FOLLOW UP:  If any biopsies were taken you will be contacted by phone or by letter within the next 1-3 weeks. Call [phone]  if you have not heard about the biopsies in 3 weeks.  Please also call with any specific questions about appointments or follow up tests. 

## 2021-09-05 NOTE — Anesthesia Procedure Notes (Signed)
Procedure Name: MAC Date/Time: 09/05/2021 9:58 AM Performed by: West Pugh, CRNA Pre-anesthesia Checklist: Patient identified, Emergency Drugs available, Suction available, Patient being monitored and Timeout performed Patient Re-evaluated:Patient Re-evaluated prior to induction Oxygen Delivery Method: Simple face mask Preoxygenation: Pre-oxygenation with 100% oxygen Placement Confirmation: positive ETCO2 Dental Injury: Teeth and Oropharynx as per pre-operative assessment

## 2021-09-05 NOTE — Transfer of Care (Signed)
Immediate Anesthesia Transfer of Care Note  Patient: Mariah Baker  Procedure(s) Performed: COLONOSCOPY WITH PROPOFOL POLYPECTOMY  Patient Location: PACU and Endoscopy Unit  Anesthesia Type:MAC  Level of Consciousness: awake, alert , oriented and patient cooperative  Airway & Oxygen Therapy: Patient Spontanous Breathing and Patient connected to nasal cannula oxygen  Post-op Assessment: Report given to RN and Post -op Vital signs reviewed and stable  Post vital signs: Reviewed and stable  Last Vitals:  Vitals Value Taken Time  BP 134/80 09/05/21 1031  Temp    Pulse 85 09/05/21 1033  Resp 23 09/05/21 1033  SpO2 97 % 09/05/21 1033  Vitals shown include unvalidated device data.  Last Pain:  Vitals:   09/05/21 1031  TempSrc:   PainSc: 0-No pain         Complications: No notable events documented.

## 2021-09-05 NOTE — Anesthesia Postprocedure Evaluation (Signed)
Anesthesia Post Note  Patient: Mariah Baker  Procedure(s) Performed: COLONOSCOPY WITH PROPOFOL POLYPECTOMY     Patient location during evaluation: Endoscopy Anesthesia Type: MAC Level of consciousness: awake and alert Pain management: pain level controlled Vital Signs Assessment: post-procedure vital signs reviewed and stable Respiratory status: spontaneous breathing, nonlabored ventilation, respiratory function stable and patient connected to nasal cannula oxygen Cardiovascular status: blood pressure returned to baseline and stable Postop Assessment: no apparent nausea or vomiting Anesthetic complications: no   No notable events documented.  Last Vitals:  Vitals:   09/05/21 1040 09/05/21 1050  BP: 134/76 (!) 142/88  Pulse: 77 71  Resp: (!) 23 20  Temp:    SpO2: 96% 95%    Last Pain:  Vitals:   09/05/21 1050  TempSrc:   PainSc: 0-No pain                 Gerardine Peltz L Kendrix Orman

## 2021-09-06 LAB — SURGICAL PATHOLOGY

## 2021-09-08 ENCOUNTER — Encounter (HOSPITAL_COMMUNITY): Payer: Self-pay | Admitting: Gastroenterology

## 2022-06-28 ENCOUNTER — Other Ambulatory Visit: Payer: Self-pay | Admitting: Obstetrics & Gynecology

## 2022-06-28 DIAGNOSIS — Z1231 Encounter for screening mammogram for malignant neoplasm of breast: Secondary | ICD-10-CM

## 2022-07-20 ENCOUNTER — Ambulatory Visit
Admission: RE | Admit: 2022-07-20 | Discharge: 2022-07-20 | Disposition: A | Discharge status: Home / Self Care | Payer: 59 | Source: Ambulatory Visit

## 2022-07-20 DIAGNOSIS — Z1231 Encounter for screening mammogram for malignant neoplasm of breast: Secondary | ICD-10-CM

## 2022-07-23 ENCOUNTER — Encounter: Payer: Self-pay | Admitting: Obstetrics & Gynecology

## 2022-07-23 ENCOUNTER — Ambulatory Visit (INDEPENDENT_AMBULATORY_CARE_PROVIDER_SITE_OTHER): Payer: 59 | Admitting: Obstetrics & Gynecology

## 2022-07-23 VITALS — BP 112/68 | HR 72 | Resp 20 | Ht 63.78 in | Wt 278.6 lb

## 2022-07-23 DIAGNOSIS — N951 Menopausal and female climacteric states: Secondary | ICD-10-CM

## 2022-07-23 DIAGNOSIS — Z01419 Encounter for gynecological examination (general) (routine) without abnormal findings: Secondary | ICD-10-CM

## 2022-07-23 DIAGNOSIS — Z6841 Body Mass Index (BMI) 40.0 and over, adult: Secondary | ICD-10-CM | POA: Diagnosis not present

## 2022-07-23 MED ORDER — NORETHINDRONE 0.35 MG PO TABS: ORAL_TABLET | ORAL | 4 refills | Status: DC

## 2022-07-23 NOTE — Progress Notes (Signed)
[facility] 1971/04/15 [id number]   History:    51 y.o.  G1P1L1 Married.  Daughter is 25 1/2 yo, IT consultant.   RP:  Established patient presenting for annual gyn exam   HPI: Well on the Progestin-only BCP, light menses every 1-3 months.  No BTB.  Menopausal Sxs with hot flushes, night sweats occasionally.  No pelvic pain.  No pain with intercourse.  Normal vaginal secretions.  Pap Neg 05/2021.  No h/o abnormal Pap.  Will repeat Pap at 3 years.  Urine and bowel movements normal.  Colono Neg 09/2021.  Breasts normal.  Mammo Neg 07/2022.  Body mass index decreased to 48.15 (55.27 last year).  On a low calorie diet.  Will increase fitness activities after Rt hip replacement.  Health labs with family physician.   Past medical history,surgical history, family history and social history were all reviewed and documented in the EPIC chart.  Gynecologic History Patient's last menstrual period was 05/09/2022 (exact date).  Obstetric History OB History  Gravida Para Term Preterm AB Living  1 1       1   SAB IAB Ectopic Multiple Live Births               # Outcome Date GA Lbr Len/2nd Weight Sex Delivery Anes PTL Lv  1 Para              ROS: A ROS was performed and pertinent positives and negatives are included in the history. GENERAL: No fevers or chills. HEENT: No change in vision, no earache, sore throat or sinus congestion. NECK: No pain or stiffness. CARDIOVASCULAR: No chest pain or pressure. No palpitations. PULMONARY: No shortness of breath, cough or wheeze. GASTROINTESTINAL: No abdominal pain, nausea, vomiting or diarrhea, melena or bright red blood per rectum. GENITOURINARY: No urinary frequency, urgency, hesitancy or dysuria. MUSCULOSKELETAL: No joint or muscle pain, no back pain, no recent trauma. DERMATOLOGIC: No rash, no itching, no lesions. ENDOCRINE: No polyuria, polydipsia, no heat or cold intolerance. No recent change in weight. HEMATOLOGICAL: No anemia or easy bruising or bleeding.  NEUROLOGIC: No headache, seizures, numbness, tingling or weakness. PSYCHIATRIC: No depression, no loss of interest in normal activity or change in sleep pattern.     Exam:   BP 112/68 (BP Location: Right Arm, Patient Position: Sitting)   Pulse 72   Resp 20   Ht 5' 3.78" (1.62 m)   Wt 278 lb 9.6 oz (126.4 kg)   LMP 05/09/2022 (Exact Date)   SpO2 98%   BMI 48.15 kg/m   Body mass index is 48.15 kg/m.  General appearance : Well developed well nourished female. No acute distress HEENT: Eyes: no retinal hemorrhage or exudates,  Neck supple, trachea midline, no carotid bruits, no thyroidmegaly Lungs: Clear to auscultation, no rhonchi or wheezes, or rib retractions  Heart: Regular rate and rhythm, no murmurs or gallops Breast:Examined in sitting and supine position were symmetrical in appearance, no palpable masses or tenderness,  no skin retraction, no nipple inversion, no nipple discharge, no skin discoloration, no axillary or supraclavicular lymphadenopathy Abdomen: no palpable masses or tenderness, no rebound or guarding Extremities: no edema or skin discoloration or tenderness  Pelvic: Vulva: Normal             Vagina: No gross lesions or discharge  Cervix: No gross lesions or discharge  Uterus  AV, normal size, shape and consistency, non-tender and mobile  Adnexa  Without masses or tenderness  Anus: Normal   Assessment/Plan:  51 y.o. female for annual exam   1. Well female exam with routine gynecological exam Well on the Progestin-only BCP, light menses every 1-3 months.  No BTB.  Menopausal Sxs with hot flushes, night sweats occasionally.  No pelvic pain.  No pain with intercourse.  Normal vaginal secretions.  Pap Neg 05/2021.  No h/o abnormal Pap.  Will repeat Pap at 3 years.  Urine and bowel movements normal.  Colono Neg 09/2021.  Breasts normal.  Mammo Neg 07/2022.  Body mass index decreased to 48.15 (55.27 last year).  On a low calorie diet.  Will increase fitness activities  after Rt hip replacement.  Health labs with family physician.   2. Perimenopause Well on the Progestin-only BCP, light menses every 1-3 months.  No BTB.  Menopausal Sxs with hot flushes, night sweats occasionally.  No pelvic pain.  No pain with intercourse.   3. Class 3 severe obesity due to excess calories with serious comorbidity and body mass index (BMI) of 45.0 to 49.9 in adult (HCC) Body mass index decreased to 48.15 (55.27 last year).  On a low calorie diet.  Will increase fitness activities after Rt hip replacement.   Other orders - Coenzyme Q10 (COQ10) 100 MG CAPS; 1 capsule daily. - diclofenac (VOLTAREN) 75 MG EC tablet; Take 75 mg by mouth 2 (two) times daily. - ezetimibe (ZETIA) 10 MG tablet; Take 10 mg by mouth daily. - Omega-3 Fatty Acids (FISH OIL) 1200 MG CAPS; Take 1,200 mg by mouth daily. - Magnesium 500 MG TABS; Take 500 mg by mouth daily. - Turmeric (QC TUMERIC COMPLEX PO); Take 7,800 mg by mouth daily. - Loratadine 10 MG CAPS; Take 10 mg by mouth. - Glucosamine-Chondroitin (GLUCOSAMINE CHONDR COMPLEX PO); Take by mouth. - olmesartan-hydrochlorothiazide (BENICAR HCT) 40-25 MG tablet; Take 1 tablet by mouth daily. - norethindrone (HEATHER) 0.35 MG tablet; TAKE ONE (1) TABLET BY MOUTH EVERY DAY   Princess Bruins MD, 10:25 AM 07/23/2022

## 2022-12-12 ENCOUNTER — Ambulatory Visit: Payer: Self-pay | Admitting: Physician Assistant

## 2022-12-12 DIAGNOSIS — G8929 Other chronic pain: Secondary | ICD-10-CM

## 2022-12-12 NOTE — Progress Notes (Signed)
Sent message, via epic in basket, requesting orders in epic from surgeon.  

## 2022-12-12 NOTE — H&P (Signed)
TOTAL HIP ADMISSION H&P  Patient is admitted for right total hip arthroplasty.  Subjective:  Chief Complaint: right hip pain  HPI: Mariah Baker, 51 y.o. female, has a history of pain and functional disability in the right hip(s) due to arthritis and patient has failed non-surgical conservative treatments for greater than 12 weeks to include NSAID's and/or analgesics, corticosteriod injections, flexibility and strengthening excercises, use of assistive devices, weight reduction as appropriate, and activity modification.  Onset of symptoms was gradual starting 5 years ago with gradually worsening course since that time.The patient noted no past surgery on the right hip(s).  Patient currently rates pain in the right hip at 10 out of 10 with activity. Patient has night pain, worsening of pain with activity and weight bearing, trendelenberg gait, pain that interfers with activities of daily living, and pain with passive range of motion. Patient has evidence of subchondral cysts, periarticular osteophytes, and joint space narrowing by imaging studies. This condition presents safety issues increasing the risk of falls.  There is no current active infection.  Patient Active Problem List   Diagnosis Date Noted  . DM (diabetes mellitus) (H[facility]) 12/12/2011  . HTN (hypertension) 12/12/2011   Past Medical History:  Diagnosis Date  . Arthritis    in hip  . Complication of anesthesia    takes a little bit longer to wake up  . Diabetes mellitus    Type 2  . Hypertension   . Hypothyroidism    "running high on my thyroid tests on low dose synthroid    Past Surgical History:  Procedure Laterality Date  . ARTHOSCOPIC ROTAOR CUFF REPAIR Left 09/22/2018   Procedure: ARTHROSCOPIC ROTATOR CUFF REPAIR;  Surgeon: Varkey, Dax T, MD;  Location: [facility];  Service: Orthopedics;  Laterality: Left;  . BICEPT TENODESIS Left 09/22/2018   Procedure: BICEPS TENODESIS;  Surgeon: Varkey, Dax T, MD;  Location: [facility];   Service: Orthopedics;  Laterality: Left;  . COLONOSCOPY WITH PROPOFOL N/A 09/05/2021   Procedure: COLONOSCOPY WITH PROPOFOL;  Surgeon: Brahmbhatt, Parag, MD;  Location: [facility];  Service: Gastroenterology;  Laterality: N/A;  . KNEE ARTHROSCOPY    . POLYPECTOMY  09/05/2021   Procedure: POLYPECTOMY;  Surgeon: Brahmbhatt, Parag, MD;  Location: [facility];  Service: Gastroenterology;;  . SHOULDER ACROMIOPLASTY Left 09/22/2018   Procedure: SHOULDER ACROMIOPLASTY;  Surgeon: Varkey, Dax T, MD;  Location: [facility];  Service: Orthopedics;  Laterality: Left;  . SHOULDER ARTHROSCOPY Left 09/22/2018   Procedure: ARTHROSCOPY SHOULDER WITH CLAVICALECTOMY;  Surgeon: Varkey, Dax T, MD;  Location: [facility];  Service: Orthopedics;  Laterality: Left;  . TONSILECTOMY, ADENOIDECTOMY, BILATERAL MYRINGOTOMY AND TUBES      Current Outpatient Medications  Medication Sig Dispense Refill Last Dose  . cholecalciferol (VITAMIN D3) 25 MCG (1000 UNIT) tablet Take 1,000 Units by mouth daily.     . Coenzyme Q10 (COQ10) 100 MG CAPS 1 capsule daily.     . diclofenac (VOLTAREN) 75 MG EC tablet Take 75 mg by mouth 2 (two) times daily.     . ezetimibe (ZETIA) 10 MG tablet Take 10 mg by mouth daily.     . Glucosamine-Chondroitin (GLUCOSAMINE CHONDR COMPLEX PO) Take by mouth.     . levothyroxine (SYNTHROID, LEVOTHROID) 50 MCG tablet Take 50 mcg by mouth daily before breakfast.     . Loratadine 10 MG CAPS Take 10 mg by mouth.     . Magnesium 500 MG TABS Take 500 mg by mouth daily.     . Menaquinone-7 (  K2 PO) Take 1 tablet by mouth daily.     . metFORMIN (GLUCOPHAGE) 500 MG tablet Take 500 mg by mouth 2 (two) times daily with a meal.     . olmesartan-hydrochlorothiazide (BENICAR HCT) 40-25 MG tablet Take 1 tablet by mouth daily.     . Omega-3 Fatty Acids (FISH OIL) 1200 MG CAPS Take 1,200 mg by mouth daily.     . QUERCETIN PO Take 1 capsule by mouth daily.     . topiramate (TOPAMAX) 100 MG tablet Take 50 mg by mouth at bedtime.       . Turmeric (QC TUMERIC COMPLEX PO) Take 7,800 mg by mouth daily.     . Zinc 50 MG CAPS Take 50 mg by mouth daily.      No current facility-administered medications for this visit.   No Known Allergies  Social History   Tobacco Use  . Smoking status: Former    Types: Cigarettes    Quit date: 12/12/2003    Years since quitting: 19.0  . Smokeless tobacco: Never  Substance Use Topics  . Alcohol use: Not Currently    Family History  Adopted: Yes  Family history unknown: Yes     Review of Systems  Musculoskeletal:  Positive for arthralgias.  All other systems reviewed and are negative.  Objective:  Physical Exam Constitutional:      General: She is not in acute distress.    Appearance: Normal appearance.  HENT:     Head: Normocephalic and atraumatic.  Eyes:     Extraocular Movements: Extraocular movements intact.     Pupils: Pupils are equal, round, and reactive to light.  Cardiovascular:     Rate and Rhythm: Normal rate and regular rhythm.     Pulses: Normal pulses.     Heart sounds: Normal heart sounds.  Pulmonary:     Effort: Pulmonary effort is normal. No respiratory distress.     Breath sounds: Normal breath sounds. No wheezing.  Abdominal:     General: Abdomen is flat. Bowel sounds are normal. There is no distension.     Palpations: Abdomen is soft.     Tenderness: There is no abdominal tenderness.  Musculoskeletal:     Cervical back: Normal range of motion and neck supple.     Comments: On examination she ambulates with a moderately antalgic gait.  She has about one centimeter leg length discrepancy.  Moderate lateral adipose tissue, predominantly abdominal adipose.  Limited ROM painful arc of motion R hip  Lymphadenopathy:     Cervical: No cervical adenopathy.  Skin:    General: Skin is warm and dry.     Findings: No erythema or rash.  Neurological:     General: No focal deficit present.     Mental Status: She is alert and oriented to person, place, and  time.  Psychiatric:        Mood and Affect: Mood normal.        Behavior: Behavior normal.   Vital signs in last 24 hours: @VSRANGES@  Labs:   Estimated body mass index is 48.15 kg/m as calculated from the following:   Height as of 07/23/22: 5' 3.78" (1.62 m).   Weight as of 07/23/22: 126.4 kg.   Imaging Review Plain radiographs demonstrate severe degenerative joint disease of the right hip(s). The bone quality appears to be good for age and reported activity level.      Assessment/Plan:  End stage arthritis, right hip(s)  The patient history, physical examination,   clinical judgement of the provider and imaging studies are consistent with end stage degenerative joint disease of the right hip(s) and total hip arthroplasty is deemed medically necessary. The treatment options including medical management, injection therapy, arthroscopy and arthroplasty were discussed at length. The risks and benefits of total hip arthroplasty were presented and reviewed. The risks due to aseptic loosening, infection, stiffness, dislocation/subluxation,  thromboembolic complications and other imponderables were discussed.  The patient acknowledged the explanation, agreed to proceed with the plan and consent was signed. Patient is being admitted for inpatient treatment for surgery, pain control, PT, OT, prophylactic antibiotics, VTE prophylaxis, progressive ambulation and ADL's and discharge planning.The patient is planning to be discharged  home with outpt PT    Patient's anticipated LOS is less than 2 midnights, meeting these requirements: - Younger than 65 - Lives within 1 hour of care - Has a competent adult at home to recover with post-op recover - NO history of  - Chronic pain requiring opiods  - Diabetes  - Coronary Artery Disease  - Heart failure  - Heart attack  - Stroke  - DVT/VTE  - Cardiac arrhythmia  - Respiratory Failure/COPD  - Renal failure  - Anemia  - Advanced Liver  disease   

## 2022-12-12 NOTE — H&P (View-Only) (Signed)
TOTAL HIP ADMISSION H&P  Patient is admitted for right total hip arthroplasty.  Subjective:  Chief Complaint: right hip pain  HPI: Mariah Baker, 52 y.o. female, has a history of pain and functional disability in the right hip(s) due to arthritis and patient has failed non-surgical conservative treatments for greater than 12 weeks to include NSAID's and/or analgesics, corticosteriod injections, flexibility and strengthening excercises, use of assistive devices, weight reduction as appropriate, and activity modification.  Onset of symptoms was gradual starting 5 years ago with gradually worsening course since that time.The patient noted no past surgery on the right hip(s).  Patient currently rates pain in the right hip at 10 out of 10 with activity. Patient has night pain, worsening of pain with activity and weight bearing, trendelenberg gait, pain that interfers with activities of daily living, and pain with passive range of motion. Patient has evidence of subchondral cysts, periarticular osteophytes, and joint space narrowing by imaging studies. This condition presents safety issues increasing the risk of falls.  There is no current active infection.  Patient Active Problem List   Diagnosis Date Noted  . DM (diabetes mellitus) ([place]) 12/12/2011  . HTN (hypertension) 12/12/2011   Past Medical History:  Diagnosis Date  . Arthritis    in hip  . Complication of anesthesia    takes a little bit longer to wake up  . Diabetes mellitus    Type 2  . Hypertension   . Hypothyroidism    "running high on my thyroid tests on low dose synthroid    Past Surgical History:  Procedure Laterality Date  . ARTHOSCOPIC ROTAOR CUFF REPAIR Left 09/22/2018   Procedure: ARTHROSCOPIC ROTATOR CUFF REPAIR;  Surgeon: Hiram Gash, MD;  Location: [place];  Service: Orthopedics;  Laterality: Left;  . BICEPT TENODESIS Left 09/22/2018   Procedure: BICEPS TENODESIS;  Surgeon: Hiram Gash, MD;  Location: [place];   Service: Orthopedics;  Laterality: Left;  . COLONOSCOPY WITH PROPOFOL N/A 09/05/2021   Procedure: COLONOSCOPY WITH PROPOFOL;  Surgeon: Otis Brace, MD;  Location: [facility];  Service: Gastroenterology;  Laterality: N/A;  . KNEE ARTHROSCOPY    . POLYPECTOMY  09/05/2021   Procedure: POLYPECTOMY;  Surgeon: Otis Brace, MD;  Location: [facility];  Service: Gastroenterology;;  . SHOULDER ACROMIOPLASTY Left 09/22/2018   Procedure: SHOULDER ACROMIOPLASTY;  Surgeon: Hiram Gash, MD;  Location: [place];  Service: Orthopedics;  Laterality: Left;  . SHOULDER ARTHROSCOPY Left 09/22/2018   Procedure: ARTHROSCOPY SHOULDER WITH CLAVICALECTOMY;  Surgeon: Hiram Gash, MD;  Location: [place];  Service: Orthopedics;  Laterality: Left;  . TONSILECTOMY, ADENOIDECTOMY, BILATERAL MYRINGOTOMY AND TUBES      Current Outpatient Medications  Medication Sig Dispense Refill Last Dose  . cholecalciferol (VITAMIN D3) 25 MCG (1000 UNIT) tablet Take 1,000 Units by mouth daily.     . Coenzyme Q10 (COQ10) 100 MG CAPS 1 capsule daily.     . diclofenac (VOLTAREN) 75 MG EC tablet Take 75 mg by mouth 2 (two) times daily.     Marland Kitchen ezetimibe (ZETIA) 10 MG tablet Take 10 mg by mouth daily.     . Glucosamine-Chondroitin (GLUCOSAMINE CHONDR COMPLEX PO) Take by mouth.     . levothyroxine (SYNTHROID, LEVOTHROID) 50 MCG tablet Take 50 mcg by mouth daily before breakfast.     . Loratadine 10 MG CAPS Take 10 mg by mouth.     . Magnesium 500 MG TABS Take 500 mg by mouth daily.     Berneice Heinrich (  K2 PO) Take 1 tablet by mouth daily.     . metFORMIN (GLUCOPHAGE) 500 MG tablet Take 500 mg by mouth 2 (two) times daily with a meal.     . olmesartan-hydrochlorothiazide (BENICAR HCT) 40-25 MG tablet Take 1 tablet by mouth daily.     . Omega-3 Fatty Acids (FISH OIL) 1200 MG CAPS Take 1,200 mg by mouth daily.     Marland Kitchen QUERCETIN PO Take 1 capsule by mouth daily.     Marland Kitchen topiramate (TOPAMAX) 100 MG tablet Take 50 mg by mouth at bedtime.       . Turmeric (QC TUMERIC COMPLEX PO) Take 7,800 mg by mouth daily.     . Zinc 50 MG CAPS Take 50 mg by mouth daily.      No current facility-administered medications for this visit.   No Known Allergies  Social History   Tobacco Use  . Smoking status: Former    Types: Cigarettes    Quit date: 12/12/2003    Years since quitting: 19.0  . Smokeless tobacco: Never  Substance Use Topics  . Alcohol use: Not Currently    Family History  Adopted: Yes  Family history unknown: Yes     Review of Systems  Musculoskeletal:  Positive for arthralgias.  All other systems reviewed and are negative.  Objective:  Physical Exam Constitutional:      General: She is not in acute distress.    Appearance: Normal appearance.  HENT:     Head: Normocephalic and atraumatic.  Eyes:     Extraocular Movements: Extraocular movements intact.     Pupils: Pupils are equal, round, and reactive to light.  Cardiovascular:     Rate and Rhythm: Normal rate and regular rhythm.     Pulses: Normal pulses.     Heart sounds: Normal heart sounds.  Pulmonary:     Effort: Pulmonary effort is normal. No respiratory distress.     Breath sounds: Normal breath sounds. No wheezing.  Abdominal:     General: Abdomen is flat. Bowel sounds are normal. There is no distension.     Palpations: Abdomen is soft.     Tenderness: There is no abdominal tenderness.  Musculoskeletal:     Cervical back: Normal range of motion and neck supple.     Comments: On examination she ambulates with a moderately antalgic gait.  She has about one centimeter leg length discrepancy.  Moderate lateral adipose tissue, predominantly abdominal adipose.  Limited ROM painful arc of motion R hip  Lymphadenopathy:     Cervical: No cervical adenopathy.  Skin:    General: Skin is warm and dry.     Findings: No erythema or rash.  Neurological:     General: No focal deficit present.     Mental Status: She is alert and oriented to person, place, and  time.  Psychiatric:        Mood and Affect: Mood normal.        Behavior: Behavior normal.   Vital signs in last 24 hours: '@VSRANGES'$ @  Labs:   Estimated body mass index is 48.15 kg/m as calculated from the following:   Height as of 07/23/22: 5' 3.78" (1.62 m).   Weight as of 07/23/22: 126.4 kg.   Imaging Review Plain radiographs demonstrate severe degenerative joint disease of the right hip(s). The bone quality appears to be good for age and reported activity level.      Assessment/Plan:  End stage arthritis, right hip(s)  The patient history, physical examination,  clinical judgement of the provider and imaging studies are consistent with end stage degenerative joint disease of the right hip(s) and total hip arthroplasty is deemed medically necessary. The treatment options including medical management, injection therapy, arthroscopy and arthroplasty were discussed at length. The risks and benefits of total hip arthroplasty were presented and reviewed. The risks due to aseptic loosening, infection, stiffness, dislocation/subluxation,  thromboembolic complications and other imponderables were discussed.  The patient acknowledged the explanation, agreed to proceed with the plan and consent was signed. Patient is being admitted for inpatient treatment for surgery, pain control, PT, OT, prophylactic antibiotics, VTE prophylaxis, progressive ambulation and ADL's and discharge planning.The patient is planning to be discharged  home with outpt PT    Patient's anticipated LOS is less than 2 midnights, meeting these requirements: - Younger than 15 - Lives within 1 hour of care - Has a competent adult at home to recover with post-op recover - NO history of  - Chronic pain requiring opiods  - Diabetes  - Coronary Artery Disease  - Heart failure  - Heart attack  - Stroke  - DVT/VTE  - Cardiac arrhythmia  - Respiratory Failure/COPD  - Renal failure  - Anemia  - Advanced Liver  disease

## 2022-12-18 NOTE — Patient Instructions (Signed)
SURGICAL WAITING ROOM VISITATION  Patients having surgery or a procedure may have no more than 2 support people in the waiting area - these visitors may rotate.    Children under the age of 78 must have an adult with them who is not the patient.  Due to an increase in RSV and influenza rates and associated hospitalizations, children ages 38 and under may not visit patients in [place].  If the patient needs to stay at the hospital during part of their recovery, the visitor guidelines for inpatient rooms apply. Pre-op nurse will coordinate an appropriate time for 1 support person to accompany patient in pre-op.  This support person may not rotate.    Please refer to the [facility] website for the visitor guidelines for Inpatients (after your surgery is over and you are in a regular room).    Your procedure is scheduled on: 12/31/22   Report to [facility] Main Entrance    Report to admitting at 5:15 AM   Call this number if you have problems the morning of surgery [phone]   Do not eat food :After Midnight.   After Midnight you may have the following liquids until 4:30 AM DAY OF SURGERY  Water Non-Citrus Juices (without pulp, NO RED-Apple, White grape, White cranberry) Black Coffee (NO MILK/CREAM OR CREAMERS, sugar ok)  Clear Tea (NO MILK/CREAM OR CREAMERS, sugar ok) regular and decaf                             Plain Jell-O (NO RED)                                           Fruit ices (not with fruit pulp, NO RED)                                     Popsicles (NO RED)                                                               Sports drinks like Gatorade (NO RED)                 The day of surgery:  Drink ONE (1) Pre-Surgery G2 at 4:30 AM the morning of surgery. Drink in one sitting. Do not sip.  This drink was given to you during your hospital  pre-op appointment visit. Nothing else to drink after completing the  Pre-Surgery G2.          If you  have questions, please contact your surgeon's office.   FOLLOW BOWEL PREP AND ANY ADDITIONAL PRE OP INSTRUCTIONS YOU RECEIVED FROM YOUR SURGEON'S OFFICE!!!     Oral Hygiene is also important to reduce your risk of infection.                                    Remember - BRUSH YOUR TEETH THE MORNING OF SURGERY WITH YOUR REGULAR TOOTHPASTE  DENTURES WILL BE REMOVED  PRIOR TO SURGERY PLEASE DO NOT APPLY "Poly grip" OR ADHESIVES!!!   Take these medicines the morning of surgery with A SIP OF WATER: Zetia, Levothyroxine, Claritin   DO NOT TAKE ANY ORAL DIABETIC MEDICATIONS DAY OF YOUR SURGERY  How to Manage Your Diabetes Before and After Surgery  Why is it important to control my blood sugar before and after surgery? Improving blood sugar levels before and after surgery helps healing and can limit problems. A way of improving blood sugar control is eating a healthy diet by:  Eating less sugar and carbohydrates  Increasing activity/exercise  Talking with your doctor about reaching your blood sugar goals High blood sugars (greater than 180 mg/dL) can raise your risk of infections and slow your recovery, so you will need to focus on controlling your diabetes during the weeks before surgery. Make sure that the doctor who takes care of your diabetes knows about your planned surgery including the date and location.  How do I manage my blood sugar before surgery? Check your blood sugar at least 4 times a day, starting 2 days before surgery, to make sure that the level is not too high or low. Check your blood sugar the morning of your surgery when you wake up and every 2 hours until you get to the Short Stay unit. If your blood sugar is less than 70 mg/dL, you will need to treat for low blood sugar: Do not take insulin. Treat a low blood sugar (less than 70 mg/dL) with  cup of clear juice (cranberry or apple), 4 glucose tablets, OR glucose gel. Recheck blood sugar in 15 minutes after treatment (to  make sure it is greater than 70 mg/dL). If your blood sugar is not greater than 70 mg/dL on recheck, call [phone] for further instructions. Report your blood sugar to the short stay nurse when you get to Short Stay.  If you are admitted to the hospital after surgery: Your blood sugar will be checked by the staff and you will probably be given insulin after surgery (instead of oral diabetes medicines) to make sure you have good blood sugar levels. The goal for blood sugar control after surgery is 80-180 mg/dL.   WHAT DO I DO ABOUT MY DIABETES MEDICATION?  Do not take oral diabetes medicines (pills) the morning of surgery.  Per anesthesia hold Mounjaro 7 days (last dose 12/21/22)  DO NOT TAKE THE FOLLOWING 7 DAYS PRIOR TO SURGERY: Ozempic, Wegovy, Rybelsus (Semaglutide), Byetta (exenatide), Bydureon (exenatide ER), Victoza, Saxenda (liraglutide), or Trulicity (dulaglutide) Mounjaro (Tirzepatide) Adlyxin (Lixisenatide), Polyethylene Glycol Loxenatide.  Reviewed and Endorsed by [facility] Patient Education Committee, August 2015                              You may not have any metal on your body including hair pins, jewelry, and body piercing             Do not wear make-up, lotions, powders, perfumes, or deodorant  Do not wear nail polish including gel and S&S, artificial/acrylic nails, or any other type of covering on natural nails including finger and toenails. If you have artificial nails, gel coating, etc. that needs to be removed by a nail salon please have this removed prior to surgery or surgery may need to be canceled/ delayed if the surgeon/ anesthesia feels like they are unable to be safely monitored.   Do not shave  48 hours prior to surgery.  Do not bring valuables to the hospital. [place].   Contacts, glasses, dentures or bridgework may not be worn into surgery.  DO NOT [place]. PHARMACY WILL DISPENSE MEDICATIONS LISTED ON YOUR MEDICATION LIST TO YOU DURING YOUR ADMISSION [place]!    Patients discharged on the day of surgery will not be allowed to drive home.  Someone NEEDS to stay with you for the first 24 hours after anesthesia.              Please read over the following fact sheets you were given: IF [place] [phone] Schneiders    If you received a COVID test during your pre-op visit  it is requested that you wear a mask when out in public, stay away from anyone that may not be feeling well and notify your surgeon if you develop symptoms. If you test positive for Covid or have been in contact with anyone that has tested positive in the last 10 days please notify you surgeon.    [place] - Preparing for Surgery Before surgery, you can play an important role.  Because skin is not sterile, your skin needs to be as free of germs as possible.  You can reduce the number of germs on your skin by washing with CHG (chlorahexidine gluconate) soap before surgery.  CHG is an antiseptic cleaner which kills germs and bonds with the skin to continue killing germs even after washing. Please DO NOT use if you have an allergy to CHG or antibacterial soaps.  If your skin becomes reddened/irritated stop using the CHG and inform your nurse when you arrive at Short Stay. Do not shave (including legs and underarms) for at least 48 hours prior to the first CHG shower.  You may shave your face/neck.  Please follow these instructions carefully:  1.  Shower with CHG Soap the night before surgery and the  morning of surgery.  2.  If you choose to wash your hair, wash your hair first as usual with your normal  shampoo.  3.  After you shampoo, rinse your hair and body thoroughly to remove the shampoo.                             4.  Use CHG as you would any other liquid soap.  You can apply chg directly to the skin and  wash.  Gently with a scrungie or clean washcloth.  5.  Apply the CHG Soap to your body ONLY FROM THE NECK DOWN.   Do   not use on face/ open                           Wound or open sores. Avoid contact with eyes, ears mouth and   genitals (private parts).                       Wash face,  Genitals (private parts) with your normal soap.             6.  Wash thoroughly, paying special attention to the area where your    surgery  will be performed.  7.  Thoroughly rinse your body with warm water from the  neck down.  8.  DO NOT shower/wash with your normal soap after using and rinsing off the CHG Soap.                9.  Pat yourself dry with a clean towel.            10.  Wear clean pajamas.            11.  Place clean sheets on your bed the night of your first shower and do not  sleep with pets. Day of Surgery : Do not apply any lotions/deodorants the morning of surgery.  Please wear clean clothes to the hospital/surgery center.  FAILURE TO FOLLOW THESE INSTRUCTIONS MAY RESULT IN THE CANCELLATION OF YOUR SURGERY  PATIENT SIGNATURE_________________________________  NURSE SIGNATURE__________________________________  ________________________________________________________________________  Mariah Baker  An incentive spirometer is a tool that can help keep your lungs clear and active. This tool measures how well you are filling your lungs with each breath. Taking long deep breaths may help reverse or decrease the chance of developing breathing (pulmonary) problems (especially infection) following: A long period of time when you are unable to move or be active. BEFORE THE PROCEDURE  If the spirometer includes an indicator to show your best effort, your nurse or respiratory therapist will set it to a desired goal. If possible, sit up straight or lean slightly forward. Try not to slouch. Hold the incentive spirometer in an upright position. INSTRUCTIONS FOR USE  Sit on the edge of your  bed if possible, or sit up as far as you can in bed or on a chair. Hold the incentive spirometer in an upright position. Breathe out normally. Place the mouthpiece in your mouth and seal your lips tightly around it. Breathe in slowly and as deeply as possible, raising the piston or the ball toward the top of the column. Hold your breath for 3-5 seconds or for as long as possible. Allow the piston or ball to fall to the bottom of the column. Remove the mouthpiece from your mouth and breathe out normally. Rest for a few seconds and repeat Steps 1 through 7 at least 10 times every 1-2 hours when you are awake. Take your time and take a few normal breaths between deep breaths. The spirometer may include an indicator to show your best effort. Use the indicator as a goal to work toward during each repetition. After each set of 10 deep breaths, practice coughing to be sure your lungs are clear. If you have an incision (the cut made at the time of surgery), support your incision when coughing by placing a pillow or rolled up towels firmly against it. Once you are able to get out of bed, walk around indoors and cough well. You may stop using the incentive spirometer when instructed by your caregiver.  RISKS AND COMPLICATIONS Take your time so you do not get dizzy or light-headed. If you are in pain, you may need to take or ask for pain medication before doing incentive spirometry. It is harder to take a deep breath if you are having pain. AFTER USE Rest and breathe slowly and easily. It can be helpful to keep track of a log of your progress. Your caregiver can provide you with a simple table to help with this. If you are using the spirometer at home, follow these instructions: [place] IF:  You are having difficultly using the spirometer. You have trouble using the spirometer as often as instructed. Your  pain medication is not giving enough relief while using the spirometer. You develop fever  of 100.5 F (38.1 C) or higher. SEEK IMMEDIATE MEDICAL CARE IF:  You cough up bloody sputum that had not been present before. You develop fever of 102 F (38.9 C) or greater. You develop worsening pain at or near the incision site. MAKE SURE YOU:  Understand these instructions. Will watch your condition. Will get help right away if you are not doing well or get worse. Document Released: 03/04/2007 Document Revised: 01/14/2012 Document Reviewed: 05/05/2007 Exit[employer] Patient Information 2014 [employer], [place].   ________________________________________________________________________ WHAT IS A BLOOD TRANSFUSION? Blood Transfusion Information  A transfusion is the replacement of blood or some of its parts. Blood is made up of multiple cells which provide different functions. Red blood cells carry oxygen and are used for blood loss replacement. White blood cells fight against infection. Platelets control bleeding. Plasma helps clot blood. Other blood products are available for specialized needs, such as hemophilia or other clotting disorders. BEFORE THE TRANSFUSION  Who gives blood for transfusions?  Healthy volunteers who are fully evaluated to make sure their blood is safe. This is blood bank blood. Transfusion therapy is the safest it has ever been in the practice of medicine. Before blood is taken from a donor, a complete history is taken to make sure that person has no history of diseases nor engages in risky social behavior (examples are intravenous drug use or sexual activity with multiple partners). The donor's travel history is screened to minimize risk of transmitting infections, such as malaria. The donated blood is tested for signs of infectious diseases, such as HIV and hepatitis. The blood is then tested to be sure it is compatible with you in order to minimize the chance of a transfusion reaction. If you or a relative donates blood, this is often done in anticipation of surgery and  is not appropriate for emergency situations. It takes many days to process the donated blood. RISKS AND COMPLICATIONS Although transfusion therapy is very safe and saves many lives, the main dangers of transfusion include:  Getting an infectious disease. Developing a transfusion reaction. This is an allergic reaction to something in the blood you were given. Every precaution is taken to prevent this. The decision to have a blood transfusion has been considered carefully by your caregiver before blood is given. Blood is not given unless the benefits outweigh the risks. AFTER THE TRANSFUSION Right after receiving a blood transfusion, you will usually feel much better and more energetic. This is especially true if your red blood cells have gotten low (anemic). The transfusion raises the level of the red blood cells which carry oxygen, and this usually causes an energy increase. The nurse administering the transfusion will monitor you carefully for complications. HOME CARE INSTRUCTIONS  No special instructions are needed after a transfusion. You may find your energy is better. Speak with your caregiver about any limitations on activity for underlying diseases you may have. SEEK MEDICAL CARE IF:  Your condition is not improving after your transfusion. You develop redness or irritation at the intravenous (IV) site. SEEK IMMEDIATE MEDICAL CARE IF:  Any of the following symptoms occur over the next 12 hours: Shaking chills. You have a temperature by mouth above 102 F (38.9 C), not controlled by medicine. Chest, back, or muscle pain. People around you feel you are not acting correctly or are confused. Shortness of breath or difficulty breathing. Dizziness and fainting. You get a rash or  develop hives. You have a decrease in urine output. Your urine turns a dark color or changes to pink, red, or brown. Any of the following symptoms occur over the next 10 days: You have a temperature by mouth above  102 F (38.9 C), not controlled by medicine. Shortness of breath. Weakness after normal activity. The white part of the eye turns yellow (jaundice). You have a decrease in the amount of urine or are urinating less often. Your urine turns a dark color or changes to pink, red, or brown. Document Released: 10/19/2000 Document Revised: 01/14/2012 Document Reviewed: 06/07/2008 [facility] Patient Information 2014 [place], [place].  _______________________________________________________________________

## 2022-12-18 NOTE — Progress Notes (Signed)
COVID Vaccine Completed:  Date of COVID positive in last 90 days:  PCP - Sharilyn Sites, MD Cardiologist -   Chest x-ray -  EKG -  Stress Test -  ECHO -  Cardiac Cath -  Pacemaker/ICD device last checked: Spinal Cord Stimulator:  Bowel Prep -   Sleep Study -  CPAP -   Fasting Blood Sugar -  Checks Blood Sugar _____ times a day  Last dose of GLP1 agonist-  Mounjaro GLP1 instructions:     Last dose of SGLT-2 inhibitors-  N/A SGLT-2 instructions: N/A   Blood Thinner Instructions: Aspirin Instructions: Last Dose:  Activity level:  Can go up a flight of stairs and perform activities of daily living without stopping and without symptoms of chest pain or shortness of breath.  Able to exercise without symptoms  Unable to go up a flight of stairs without symptoms of     Anesthesia review:   Patient denies shortness of breath, fever, cough and chest pain at PAT appointment  Patient verbalized understanding of instructions that were given to them at the PAT appointment. Patient was also instructed that they will need to review over the PAT instructions again at home before surgery.

## 2022-12-19 ENCOUNTER — Encounter (HOSPITAL_COMMUNITY)
Admission: RE | Admit: 2022-12-19 | Discharge: 2022-12-19 | Disposition: A | Discharge status: Home / Self Care | Payer: 59 | Source: Ambulatory Visit | Attending: Orthopedic Surgery | Admitting: Orthopedic Surgery

## 2022-12-19 ENCOUNTER — Encounter (HOSPITAL_COMMUNITY): Payer: Self-pay

## 2022-12-19 VITALS — BP 136/79 | HR 73 | Temp 98.0°F | Resp 16 | Ht 64.0 in | Wt 234.0 lb

## 2022-12-19 DIAGNOSIS — G8929 Other chronic pain: Secondary | ICD-10-CM

## 2022-12-19 DIAGNOSIS — E119 Type 2 diabetes mellitus without complications: Secondary | ICD-10-CM | POA: Insufficient documentation

## 2022-12-19 DIAGNOSIS — Z01818 Encounter for other preprocedural examination: Secondary | ICD-10-CM

## 2022-12-19 DIAGNOSIS — M25551 Pain in right hip: Secondary | ICD-10-CM | POA: Diagnosis not present

## 2022-12-19 DIAGNOSIS — R9431 Abnormal electrocardiogram [ECG] [EKG]: Secondary | ICD-10-CM | POA: Insufficient documentation

## 2022-12-19 HISTORY — DX: Headache, unspecified: R51.9

## 2022-12-19 LAB — COMPREHENSIVE METABOLIC PANEL
ALT: 20 U/L (ref 0–44)
AST: 18 U/L (ref 15–41)
Albumin: 4.4 g/dL (ref 3.5–5.0)
Alkaline Phosphatase: 70 U/L (ref 38–126)
Anion gap: 11 (ref 5–15)
BUN: 31 mg/dL — ABNORMAL HIGH (ref 6–20)
CO2: 25 mmol/L (ref 22–32)
Calcium: 9.6 mg/dL (ref 8.9–10.3)
Chloride: 103 mmol/L (ref 98–111)
Creatinine, Ser: 0.79 mg/dL (ref 0.44–1.00)
GFR, Estimated: >60 mL/min (ref >60)
Glucose, Bld: 91 mg/dL (ref 70–99)
Potassium: 3.4 mmol/L — ABNORMAL LOW (ref 3.5–5.1)
Sodium: 139 mmol/L (ref 135–145)
Total Bilirubin: 0.6 mg/dL (ref 0.3–1.2)
Total Protein: 7.7 g/dL (ref 6.5–8.1)

## 2022-12-19 LAB — CBC WITH DIFFERENTIAL/PLATELET
Abs Immature Granulocytes: 0.02 10*3/uL (ref 0.00–0.07)
Basophils Absolute: 0 10*3/uL (ref 0.0–0.1)
Basophils Relative: 0 %
Eosinophils Absolute: 0.1 10*3/uL (ref 0.0–0.5)
Eosinophils Relative: 1 %
HCT: 43.8 % (ref 36.0–46.0)
Hemoglobin: 14.1 g/dL (ref 12.0–15.0)
Immature Granulocytes: 0 %
Lymphocytes Relative: 31 %
Lymphs Abs: 3.1 10*3/uL (ref 0.7–4.0)
MCH: 29.2 pg (ref 26.0–34.0)
MCHC: 32.2 g/dL (ref 30.0–36.0)
MCV: 90.7 fL (ref 80.0–100.0)
Monocytes Absolute: 0.7 10*3/uL (ref 0.1–1.0)
Monocytes Relative: 6 %
Neutro Abs: 6.2 10*3/uL (ref 1.7–7.7)
Neutrophils Relative %: 62 %
Platelets: 242 10*3/uL (ref 150–400)
RBC: 4.83 MIL/uL (ref 3.87–5.11)
RDW: 14.7 % (ref 11.5–15.5)
WBC: 10.1 10*3/uL (ref 4.0–10.5)
nRBC: 0 % (ref 0.0–0.2)

## 2022-12-19 LAB — HEMOGLOBIN A1C
Hgb A1c MFr Bld: 5 % (ref 4.8–5.6)
Mean Plasma Glucose: 96.8 mg/dL

## 2022-12-19 LAB — TYPE AND SCREEN
ABO/RH(D): AB POS
Antibody Screen: NEGATIVE

## 2022-12-19 LAB — SURGICAL PCR SCREEN
MRSA, PCR: NEGATIVE
Staphylococcus aureus: NEGATIVE

## 2022-12-19 LAB — GLUCOSE, CAPILLARY: Glucose-Capillary: 92 mg/dL (ref 70–99)

## 2022-12-29 NOTE — Anesthesia Preprocedure Evaluation (Addendum)
Anesthesia Evaluation  Patient identified by MRN, date of birth, ID band Patient awake    Reviewed: Allergy & Precautions, H&P , NPO status , Patient's Chart, lab work & pertinent test results  Airway Mallampati: II  TM Distance: >3 FB Neck ROM: Full    Dental no notable dental hx. (+) Teeth Intact, Dental Advisory Given   Pulmonary neg pulmonary ROS, former smoker   Pulmonary exam normal breath sounds clear to auscultation       Cardiovascular Exercise Tolerance: Good hypertension, On Medications  Rhythm:Regular Rate:Normal     Neuro/Psych  Headaches  negative psych ROS   GI/Hepatic negative GI ROS, Neg liver ROS,,,  Endo/Other  diabetes, Type 2, Oral Hypoglycemic AgentsHypothyroidism  Morbid obesity  Renal/GU negative Renal ROS  negative genitourinary   Musculoskeletal  (+) Arthritis ,    Abdominal   Peds  Hematology negative hematology ROS (+)   Anesthesia Other Findings   Reproductive/Obstetrics negative OB ROS                             Anesthesia Physical Anesthesia Plan  ASA: 3  Anesthesia Plan: Spinal   Post-op Pain Management: Tylenol PO (pre-op)*   Induction: Intravenous  PONV Risk Score and Plan: 3 and Ondansetron, Propofol infusion and Midazolam  Airway Management Planned: Natural Airway and Simple Face Mask  Additional Equipment:   Intra-op Plan:   Post-operative Plan:   Informed Consent: I have reviewed the patients History and Physical, chart, labs and discussed the procedure including the risks, benefits and alternatives for the proposed anesthesia with the patient or authorized representative who has indicated his/her understanding and acceptance.     Dental advisory given  Plan Discussed with: CRNA  Anesthesia Plan Comments:        Anesthesia Quick Evaluation

## 2022-12-30 MED ORDER — TRANEXAMIC ACID 1000 MG/10ML IV SOLN
2000.0000 mg | INTRAVENOUS | Status: DC
  Filled 2022-12-30: qty 20

## 2022-12-31 ENCOUNTER — Ambulatory Visit (HOSPITAL_COMMUNITY)
Admission: RE | Admit: 2022-12-31 | Discharge: 2022-12-31 | Disposition: A | Discharge status: Home / Self Care | Payer: 59 | Attending: Orthopedic Surgery | Admitting: Orthopedic Surgery

## 2022-12-31 ENCOUNTER — Ambulatory Visit (HOSPITAL_COMMUNITY): Payer: 59

## 2022-12-31 ENCOUNTER — Encounter (HOSPITAL_COMMUNITY): Payer: Self-pay | Admitting: Orthopedic Surgery

## 2022-12-31 ENCOUNTER — Ambulatory Visit (HOSPITAL_COMMUNITY): Payer: 59 | Admitting: Anesthesiology

## 2022-12-31 ENCOUNTER — Encounter (HOSPITAL_COMMUNITY)
Admission: RE | Disposition: A | Discharge status: Home / Self Care | Payer: Self-pay | Source: Home / Self Care | Attending: Orthopedic Surgery

## 2022-12-31 ENCOUNTER — Other Ambulatory Visit: Payer: Self-pay

## 2022-12-31 ENCOUNTER — Ambulatory Visit (HOSPITAL_BASED_OUTPATIENT_CLINIC_OR_DEPARTMENT_OTHER): Payer: 59 | Admitting: Anesthesiology

## 2022-12-31 DIAGNOSIS — Z6841 Body Mass Index (BMI) 40.0 and over, adult: Secondary | ICD-10-CM | POA: Insufficient documentation

## 2022-12-31 DIAGNOSIS — I10 Essential (primary) hypertension: Secondary | ICD-10-CM | POA: Diagnosis not present

## 2022-12-31 DIAGNOSIS — Z87891 Personal history of nicotine dependence: Secondary | ICD-10-CM | POA: Insufficient documentation

## 2022-12-31 DIAGNOSIS — Z79899 Other long term (current) drug therapy: Secondary | ICD-10-CM | POA: Insufficient documentation

## 2022-12-31 DIAGNOSIS — Z7984 Long term (current) use of oral hypoglycemic drugs: Secondary | ICD-10-CM

## 2022-12-31 DIAGNOSIS — E119 Type 2 diabetes mellitus without complications: Secondary | ICD-10-CM | POA: Insufficient documentation

## 2022-12-31 DIAGNOSIS — M1611 Unilateral primary osteoarthritis, right hip: Secondary | ICD-10-CM | POA: Diagnosis present

## 2022-12-31 DIAGNOSIS — E039 Hypothyroidism, unspecified: Secondary | ICD-10-CM | POA: Insufficient documentation

## 2022-12-31 HISTORY — PX: TOTAL HIP ARTHROPLASTY: SHX124

## 2022-12-31 LAB — ABO/RH: ABO/RH(D): AB POS

## 2022-12-31 LAB — GLUCOSE, CAPILLARY
Glucose-Capillary: 100 mg/dL — ABNORMAL HIGH (ref 70–99)
Glucose-Capillary: 79 mg/dL (ref 70–99)

## 2022-12-31 SURGERY — ARTHROPLASTY, HIP, TOTAL,POSTERIOR APPROACH
Anesthesia: Spinal | Site: Hip | Laterality: Right

## 2022-12-31 MED ORDER — METHOCARBAMOL 500 MG PO TABS: 500.0000 mg | ORAL_TABLET | Freq: Four times a day (QID) | ORAL | Status: DC | PRN

## 2022-12-31 MED ORDER — OXYCODONE HCL 5 MG PO TABS: 5.0000 mg | ORAL_TABLET | ORAL | 0 refills | Status: DC | PRN

## 2022-12-31 MED ORDER — BUPIVACAINE LIPOSOME 1.3 % IJ SUSP
INTRAMUSCULAR | Status: AC
  Filled 2022-12-31: qty 20

## 2022-12-31 MED ORDER — PROPOFOL 500 MG/50ML IV EMUL
INTRAVENOUS | Status: AC
  Filled 2022-12-31: qty 50

## 2022-12-31 MED ORDER — SODIUM CHLORIDE (PF) 0.9 % IJ SOLN
INTRAMUSCULAR | Status: AC
  Filled 2022-12-31: qty 10

## 2022-12-31 MED ORDER — SODIUM CHLORIDE 0.9 % IV SOLN: INTRAVENOUS | Status: DC

## 2022-12-31 MED ORDER — MIDAZOLAM HCL 5 MG/5ML IJ SOLN
INTRAMUSCULAR | Status: DC | PRN
  Administered 2022-12-31: 2 mg via INTRAVENOUS

## 2022-12-31 MED ORDER — ISOPROPYL ALCOHOL 70 % SOLN
Status: DC | PRN
  Administered 2022-12-31: 1 via TOPICAL

## 2022-12-31 MED ORDER — SODIUM CHLORIDE 0.9 % IR SOLN
Status: DC | PRN
  Administered 2022-12-31: 1000 mL
  Administered 2022-12-31: 3000 mL

## 2022-12-31 MED ORDER — SODIUM CHLORIDE (PF) 0.9 % IJ SOLN
INTRAMUSCULAR | Status: DC | PRN
  Administered 2022-12-31: 60 mL

## 2022-12-31 MED ORDER — METHOCARBAMOL 500 MG PO TABS: 500.0000 mg | ORAL_TABLET | Freq: Three times a day (TID) | ORAL | 0 refills | Status: AC | PRN

## 2022-12-31 MED ORDER — PROPOFOL 10 MG/ML IV BOLUS
INTRAVENOUS | Status: DC | PRN
  Administered 2022-12-31: 10 mg via INTRAVENOUS
  Administered 2022-12-31: 20 mg via INTRAVENOUS

## 2022-12-31 MED ORDER — KETOROLAC TROMETHAMINE 15 MG/ML IJ SOLN
INTRAMUSCULAR | Status: AC
  Filled 2022-12-31: qty 1

## 2022-12-31 MED ORDER — MIDAZOLAM HCL 2 MG/2ML IJ SOLN
INTRAMUSCULAR | Status: AC
  Filled 2022-12-31: qty 2

## 2022-12-31 MED ORDER — METHOCARBAMOL 500 MG IVPB - SIMPLE MED
INTRAVENOUS | Status: AC
  Filled 2022-12-31: qty 55

## 2022-12-31 MED ORDER — ISOPROPYL ALCOHOL 70 % SOLN
Status: AC
  Filled 2022-12-31: qty 480

## 2022-12-31 MED ORDER — ASPIRIN 81 MG PO TBEC: 81.0000 mg | DELAYED_RELEASE_TABLET | Freq: Two times a day (BID) | ORAL | 0 refills | Status: AC

## 2022-12-31 MED ORDER — FENTANYL CITRATE (PF) 100 MCG/2ML IJ SOLN
INTRAMUSCULAR | Status: DC | PRN
  Administered 2022-12-31 (×2): 50 ug via INTRAVENOUS

## 2022-12-31 MED ORDER — BUPIVACAINE LIPOSOME 1.3 % IJ SUSP: 10.0000 mL | Freq: Once | INTRAMUSCULAR | Status: DC

## 2022-12-31 MED ORDER — PROPOFOL 1000 MG/100ML IV EMUL
INTRAVENOUS | Status: AC
  Filled 2022-12-31: qty 100

## 2022-12-31 MED ORDER — ONDANSETRON HCL 4 MG/2ML IJ SOLN
INTRAMUSCULAR | Status: AC
  Filled 2022-12-31: qty 2

## 2022-12-31 MED ORDER — OXYCODONE HCL 5 MG PO TABS: 5.0000 mg | ORAL_TABLET | ORAL | 0 refills | Status: AC | PRN

## 2022-12-31 MED ORDER — PHENYLEPHRINE HCL-NACL 20-0.9 MG/250ML-% IV SOLN
INTRAVENOUS | Status: DC | PRN
  Administered 2022-12-31: 35 ug/min via INTRAVENOUS

## 2022-12-31 MED ORDER — ONDANSETRON HCL 4 MG PO TABS: 4.0000 mg | ORAL_TABLET | Freq: Three times a day (TID) | ORAL | 0 refills | Status: AC | PRN

## 2022-12-31 MED ORDER — SODIUM CHLORIDE (PF) 0.9 % IJ SOLN
INTRAMUSCULAR | Status: AC
  Filled 2022-12-31: qty 50

## 2022-12-31 MED ORDER — METHOCARBAMOL 500 MG IVPB - SIMPLE MED
500.0000 mg | Freq: Four times a day (QID) | INTRAVENOUS | Status: DC | PRN
  Administered 2022-12-31: 500 mg via INTRAVENOUS

## 2022-12-31 MED ORDER — OXYCODONE HCL 5 MG PO TABS: 5.0000 mg | ORAL_TABLET | ORAL | Status: DC | PRN

## 2022-12-31 MED ORDER — CEFAZOLIN SODIUM-DEXTROSE 2-4 GM/100ML-% IV SOLN: 2.0000 g | Freq: Four times a day (QID) | INTRAVENOUS | Status: DC

## 2022-12-31 MED ORDER — HYDROMORPHONE HCL 1 MG/ML IJ SOLN
0.2500 mg | INTRAMUSCULAR | Status: DC | PRN
  Administered 2022-12-31 (×2): 0.5 mg via INTRAVENOUS

## 2022-12-31 MED ORDER — LACTATED RINGERS IV SOLN: INTRAVENOUS | Status: DC

## 2022-12-31 MED ORDER — ACETAMINOPHEN 500 MG PO TABS
1000.0000 mg | ORAL_TABLET | Freq: Once | ORAL | Status: AC
  Administered 2022-12-31: 1000 mg via ORAL
  Filled 2022-12-31: qty 2

## 2022-12-31 MED ORDER — ONDANSETRON HCL 4 MG/2ML IJ SOLN
INTRAMUSCULAR | Status: DC | PRN
  Administered 2022-12-31: 4 mg via INTRAVENOUS

## 2022-12-31 MED ORDER — LACTATED RINGERS IV BOLUS: 250.0000 mL | Freq: Once | INTRAVENOUS | Status: DC

## 2022-12-31 MED ORDER — BUPIVACAINE HCL (PF) 0.5 % IJ SOLN
INTRAMUSCULAR | Status: DC | PRN
  Administered 2022-12-31: 12.5 mg via INTRATHECAL

## 2022-12-31 MED ORDER — ONDANSETRON HCL 4 MG PO TABS: 4.0000 mg | ORAL_TABLET | Freq: Four times a day (QID) | ORAL | Status: DC | PRN

## 2022-12-31 MED ORDER — ACETAMINOPHEN 500 MG PO TABS: 1000.0000 mg | ORAL_TABLET | Freq: Once | ORAL | Status: DC

## 2022-12-31 MED ORDER — ONDANSETRON HCL 4 MG/2ML IJ SOLN
4.0000 mg | Freq: Four times a day (QID) | INTRAMUSCULAR | Status: DC | PRN
  Administered 2022-12-31: 4 mg via INTRAVENOUS

## 2022-12-31 MED ORDER — CHLORHEXIDINE GLUCONATE 0.12 % MT SOLN
15.0000 mL | Freq: Once | OROMUCOSAL | Status: AC
  Administered 2022-12-31: 15 mL via OROMUCOSAL

## 2022-12-31 MED ORDER — ORAL CARE MOUTH RINSE: 15.0000 mL | Freq: Once | OROMUCOSAL | Status: AC

## 2022-12-31 MED ORDER — PROPOFOL 500 MG/50ML IV EMUL
INTRAVENOUS | Status: DC | PRN
  Administered 2022-12-31: 100 ug/kg/min via INTRAVENOUS

## 2022-12-31 MED ORDER — TRANEXAMIC ACID-NACL 1000-0.7 MG/100ML-% IV SOLN
1000.0000 mg | INTRAVENOUS | Status: AC
  Administered 2022-12-31: 1000 mg via INTRAVENOUS
  Filled 2022-12-31: qty 100

## 2022-12-31 MED ORDER — WATER FOR IRRIGATION, STERILE IR SOLN
Status: DC | PRN
  Administered 2022-12-31: 2000 mL

## 2022-12-31 MED ORDER — CEFAZOLIN IN SODIUM CHLORIDE 3-0.9 GM/100ML-% IV SOLN
3.0000 g | INTRAVENOUS | Status: AC
  Administered 2022-12-31: 3 g via INTRAVENOUS
  Filled 2022-12-31: qty 100

## 2022-12-31 MED ORDER — KETOROLAC TROMETHAMINE 15 MG/ML IJ SOLN
15.0000 mg | Freq: Four times a day (QID) | INTRAMUSCULAR | Status: DC
  Administered 2022-12-31: 15 mg via INTRAVENOUS

## 2022-12-31 MED ORDER — FENTANYL CITRATE (PF) 100 MCG/2ML IJ SOLN
INTRAMUSCULAR | Status: AC
  Filled 2022-12-31: qty 2

## 2022-12-31 MED ORDER — LACTATED RINGERS IV BOLUS
500.0000 mL | Freq: Once | INTRAVENOUS | Status: AC
  Administered 2022-12-31: 500 mL via INTRAVENOUS

## 2022-12-31 MED ORDER — BUPIVACAINE LIPOSOME 1.3 % IJ SUSP
INTRAMUSCULAR | Status: DC | PRN
  Administered 2022-12-31: 20 mL

## 2022-12-31 MED ORDER — POVIDONE-IODINE 10 % EX SWAB: 2.0000 | Freq: Once | CUTANEOUS | Status: DC

## 2022-12-31 MED ORDER — HYDROMORPHONE HCL 1 MG/ML IJ SOLN
INTRAMUSCULAR | Status: AC
  Filled 2022-12-31: qty 1

## 2022-12-31 MED ORDER — DICLOFENAC SODIUM 75 MG PO TBEC: 75.0000 mg | DELAYED_RELEASE_TABLET | Freq: Two times a day (BID) | ORAL | 0 refills | Status: AC

## 2022-12-31 SURGICAL SUPPLY — 72 items
ADH SKN CLS APL DERMABOND .7 (GAUZE/BANDAGES/DRESSINGS) ×1
APL PRP STRL LF DISP 70% ISPRP (MISCELLANEOUS) ×2
BAG COUNTER SPONGE SURGICOUNT (BAG) IMPLANT
BAG DECANTER FOR FLEXI CONT (MISCELLANEOUS) ×2 IMPLANT
BAG SPEC THK2 15X12 ZIP CLS (MISCELLANEOUS) ×1
BAG SPNG CNTER NS LX DISP (BAG)
BAG ZIPLOCK 12X15 (MISCELLANEOUS) ×2 IMPLANT
BLADE SAW SAG 25X90X1.19 (BLADE) ×2 IMPLANT
CHLORAPREP W/TINT 26 (MISCELLANEOUS) ×4 IMPLANT
COVER SURGICAL LIGHT HANDLE (MISCELLANEOUS) ×2 IMPLANT
DERMABOND ADVANCED .7 DNX12 (GAUZE/BANDAGES/DRESSINGS) ×2 IMPLANT
DRAPE HIP W/POCKET STRL (MISCELLANEOUS) ×2 IMPLANT
DRAPE INCISE IOBAN 66X45 STRL (DRAPES) ×2 IMPLANT
DRAPE INCISE IOBAN 85X60 (DRAPES) ×2 IMPLANT
DRAPE POUCH INSTRU U-SHP 10X18 (DRAPES) ×2 IMPLANT
DRAPE SHEET LG 3/4 BI-LAMINATE (DRAPES) ×6 IMPLANT
DRAPE SURG 17X11 SM STRL (DRAPES) ×2 IMPLANT
DRAPE U-SHAPE 47X51 STRL (DRAPES) ×4 IMPLANT
DRESSING AQUACEL AG SP 3.5X10 (GAUZE/BANDAGES/DRESSINGS) ×2 IMPLANT
DRSG AQUACEL AG ADV 3.5X10 (GAUZE/BANDAGES/DRESSINGS) IMPLANT
DRSG AQUACEL AG SP 3.5X10 (GAUZE/BANDAGES/DRESSINGS) ×1
ELECT BLADE TIP CTD 4 INCH (ELECTRODE) ×2 IMPLANT
ELECT REM PT RETURN 15FT ADLT (MISCELLANEOUS) ×2 IMPLANT
GLOVE BIO SURGEON STRL SZ 6.5 (GLOVE) ×4 IMPLANT
GLOVE BIOGEL PI IND STRL 6.5 (GLOVE) ×2 IMPLANT
GLOVE BIOGEL PI IND STRL 8 (GLOVE) ×2 IMPLANT
GLOVE SURG ORTHO 8.0 STRL STRW (GLOVE) ×4 IMPLANT
GOWN STRL REUS W/ TWL XL LVL3 (GOWN DISPOSABLE) ×4 IMPLANT
GOWN STRL REUS W/TWL XL LVL3 (GOWN DISPOSABLE) ×2
HANDPIECE INTERPULSE COAX TIP (DISPOSABLE)
HEAD BIOLOX HIP 36/-2.5 (Joint) IMPLANT
HIP BIOLOX HD 36/-2.5 (Joint) ×1 IMPLANT
HOLDER FOLEY CATH W/STRAP (MISCELLANEOUS) ×2 IMPLANT
HOOD PEEL AWAY T7 (MISCELLANEOUS) ×6 IMPLANT
INSERT 0 DEGREE 36 (Miscellaneous) IMPLANT
JET LAVAGE IRRISEPT WOUND (IRRIGATION / IRRIGATOR) ×1
KIT BASIN OR (CUSTOM PROCEDURE TRAY) ×2 IMPLANT
KIT TURNOVER KIT A (KITS) IMPLANT
LAVAGE JET IRRISEPT WOUND (IRRIGATION / IRRIGATOR) IMPLANT
MANIFOLD NEPTUNE II (INSTRUMENTS) ×2 IMPLANT
MARKER SKIN DUAL TIP RULER LAB (MISCELLANEOUS) ×2 IMPLANT
NDL HYPO 22X1.5 SAFETY MO (MISCELLANEOUS) IMPLANT
NEEDLE HYPO 22X1.5 SAFETY MO (MISCELLANEOUS) IMPLANT
NEEDLE SAFETY HYPO 22GAX1.5 (MISCELLANEOUS)
NS IRRIG 1000ML POUR BTL (IV SOLUTION) ×2 IMPLANT
PACK TOTAL JOINT (CUSTOM PROCEDURE TRAY) ×2 IMPLANT
PRESSURIZER FEMORAL UNIV (MISCELLANEOUS) IMPLANT
PROTECTOR NERVE ULNAR (MISCELLANEOUS) ×2 IMPLANT
RETRIEVER SUT HEWSON (MISCELLANEOUS) ×2 IMPLANT
SCREW HEX LP 6.5X15 (Screw) IMPLANT
SCREW HEX LP 6.5X30 (Screw) IMPLANT
SEALER BIPOLAR AQUA 6.0 (INSTRUMENTS) ×2 IMPLANT
SET HNDPC FAN SPRY TIP SCT (DISPOSABLE) IMPLANT
SHELL TRIDENT II CLUST 50 (Shell) IMPLANT
SPIKE FLUID TRANSFER (MISCELLANEOUS) ×6 IMPLANT
STEM HIP 127 DEG (Stem) IMPLANT
SUCTION FRAZIER HANDLE 12FR (TUBING) ×1
SUCTION TUBE FRAZIER 12FR DISP (TUBING) ×2 IMPLANT
SUT BONE WAX W31G (SUTURE) ×2 IMPLANT
SUT ETHIBOND #5 BRAIDED 30INL (SUTURE) ×2 IMPLANT
SUT MNCRL AB 3-0 PS2 18 (SUTURE) ×2 IMPLANT
SUT STRATAFIX 0 PDS 27 VIOLET (SUTURE) ×1
SUT STRATAFIX PDO 1 14 VIOLET (SUTURE) ×1
SUT STRATFX PDO 1 14 VIOLET (SUTURE) ×1
SUT VIC AB 2-0 CT2 27 (SUTURE) ×4 IMPLANT
SUTURE STRATFX 0 PDS 27 VIOLET (SUTURE) ×2 IMPLANT
SUTURE STRATFX PDO 1 14 VIOLET (SUTURE) ×2 IMPLANT
SYR 20ML LL LF (SYRINGE) ×4 IMPLANT
TOWEL OR 17X26 10 PK STRL BLUE (TOWEL DISPOSABLE) ×2 IMPLANT
TRAY FOLEY MTR SLVR 16FR STAT (SET/KITS/TRAYS/PACK) ×2 IMPLANT
UNDERPAD 30X36 HEAVY ABSORB (UNDERPADS AND DIAPERS) ×2 IMPLANT
WATER STERILE IRR 1000ML POUR (IV SOLUTION) ×4 IMPLANT

## 2022-12-31 NOTE — Anesthesia Procedure Notes (Signed)
Procedure Name: MAC Date/Time: 12/31/2022 7:21 AM  Performed by: Maxwell Caul, CRNAPre-anesthesia Checklist: Patient identified, Emergency Drugs available, Suction available and Patient being monitored Oxygen Delivery Method: Simple face mask

## 2022-12-31 NOTE — Evaluation (Signed)
Physical Therapy Evaluation Patient Details Name: Mariah Baker MRN: [id number] DOB: 01-Jul-1971 Today's Date: 12/31/2022  History of Present Illness  52 yo female presents to therapy s/p R THA, posterior approach on 12/28/2022 due to failure of conservative measures. Pt has pmh including but not limited to DM II, HTN, and  L shoulder sx x 3.  Clinical Impression  Mariah Baker is a 52 y.o. female  POD 0 s/p R THA. Patient reports mod I with the exception of LB dressing with mobility at baseline. Patient is now limited by functional impairments (see PT problem list below) and requires min guard for transfers and gait with RW. Patient was able to ambulate 45 and 20 feet with RW and min guard and cues for safe walker management. Patient educated on safe sequencing for stair mobility, car tranfers, pain management, posterior hip precautions with HO provided and reviewed and verbalized safe guarding position for people assisting with mobility. Patient instructed in exercises to facilitate ROM and circulation with HO provided. Patient will benefit from continued skilled PT interventions to address impairments and progress towards PLOF. Patient has met mobility goals at adequate level for discharge home with pt reports of OPPT; will continue to follow if pt continues acute stay to progress towards Mod I goals.        Recommendations for follow up therapy are one component of a multi-disciplinary discharge planning process, led by the attending physician.  Recommendations may be updated based on patient status, additional functional criteria and insurance authorization.  Follow Up Recommendations Follow physician's recommendations for discharge plan and follow up therapies (pt reports OPPT)      Assistance Recommended at Discharge Intermittent Supervision/Assistance  Patient can return home with the following  A little help with walking and/or transfers;A little help with  bathing/dressing/bathroom;Assistance with cooking/housework;Direct supervision/assist for financial management;Assist for transportation    Equipment Recommendations Other (comment) (pt reports DME in home setting)  Recommendations for Other Services       Functional Status Assessment Patient has had a recent decline in their functional status and demonstrates the ability to make significant improvements in function in a reasonable and predictable amount of time.     Precautions / Restrictions Precautions Precautions: Posterior Hip;Fall Precaution Booklet Issued: Yes (comment) Restrictions Weight Bearing Restrictions: No      Mobility  Bed Mobility Overal bed mobility: Needs Assistance Bed Mobility: Supine to Sit     Supine to sit: Min guard          Transfers Overall transfer level: Needs assistance Equipment used: Rolling walker (2 wheels) Transfers: Sit to/from Stand Sit to Stand: Min guard           General transfer comment: cues for proper AD, UE and R LE placement    Ambulation/Gait Ambulation/Gait assistance: Min guard Gait Distance (Feet): 45 Feet Assistive device: Rolling walker (2 wheels) Gait Pattern/deviations: Step-to pattern Gait velocity: decreased     General Gait Details: WB though B UEs to offload R LE in stance  Stairs Stairs: Yes Stairs assistance: Min guard Stair Management: Two rails Number of Stairs: 15 General stair comments: pt reports being able to hold onto the door frame to navigate 2 steps to enter home and use of SPC and R handrail to navigate flight of steps to access B and B on second floor  pt indicated that step training s/p R THA felt easirer than PLOF and pt required cues for decending with R LE and able to perform  return demonstration  Wheelchair Mobility    Modified Rankin (Stroke Patients Only)       Balance Overall balance assessment: Needs assistance Sitting-balance support: Feet unsupported, No upper  extremity supported Sitting balance-Leahy Scale: Fair     Standing balance support: Single extremity supported Standing balance-Leahy Scale: Poor                               Pertinent Vitals/Pain Pain Assessment Pain Assessment: 0-10 Pain Score: 2  Pain Location: R hip Pain Descriptors / Indicators: Aching, Guarding, Tightness Pain Intervention(s): Limited activity within patient's tolerance, Monitored during session, Premedicated before session, Ice applied    Home Living Family/patient expects to be discharged to:: Private residence Living Arrangements: Spouse/significant other;Children Available Help at Discharge: Family Type of Home: House Home Access: Stairs to enter Entrance Stairs-Rails: None Entrance Stairs-Number of Steps: 2 Alternate Level Stairs-Number of Steps: 14 Home Layout: Two level;Bed/bath upstairs Home Equipment: Rollator (4 wheels);Rolling Walker (2 wheels);Cane - single point      Prior Function Prior Level of Function : Independent/Modified Independent;Working/employed;Driving             Mobility Comments: pt required A for LB dressing, amb with RW in home setting and Rollator in community and at work, driving ADLs Comments: Husband purchased hip kit during eval     Hand Dominance        Extremity/Trunk Assessment        Lower Extremity Assessment Lower Extremity Assessment: RLE deficits/detail RLE Deficits / Details: ankle DF/PF 5/5 RLE Sensation: WNL       Communication   Communication: No difficulties  Cognition Arousal/Alertness: Awake/alert Behavior During Therapy: WFL for tasks assessed/performed Overall Cognitive Status: Within Functional Limits for tasks assessed                                          General Comments      Exercises Total Joint Exercises Ankle Circles/Pumps: AROM, Both, 20 reps Quad Sets: AROM, Right, 5 reps Heel Slides: AROM, Right, 5 reps Hip ABduction/ADduction:  AROM, Right, 5 reps Long Arc Quad: AROM, Right, 5 reps   Assessment/Plan    PT Assessment Patient needs continued PT services  PT Problem List Decreased strength;Decreased range of motion;Decreased activity tolerance;Decreased balance;Decreased mobility;Decreased coordination;Pain       PT Treatment Interventions DME instruction;Gait training;Stair training;Functional mobility training;Therapeutic activities;Therapeutic exercise;Balance training;Neuromuscular re-education;Patient/family education;Modalities    PT Goals (Current goals can be found in the Care Plan section)  Acute Rehab PT Goals Patient Stated Goal: more more normally and pain free PT Goal Formulation: With patient Time For Goal Achievement: 01/14/23 Potential to Achieve Goals: Good    Frequency 7X/week     Co-evaluation               AM-PAC PT "6 Clicks" Mobility  Outcome Measure Help needed turning from your back to your side while in a flat bed without using bedrails?: A Little Help needed moving from lying on your back to sitting on the side of a flat bed without using bedrails?: A Little Help needed moving to and from a bed to a chair (including a wheelchair)?: A Little Help needed standing up from a chair using your arms (e.g., wheelchair or bedside chair)?: A Little Help needed to walk in hospital room?: A Little Help needed  climbing 3-5 steps with a railing? : A Little 6 Click Score: 18    End of Session Equipment Utilized During Treatment: Gait belt Activity Tolerance: Patient tolerated treatment well;No increased pain Patient left: in chair;with call bell/phone within reach;with family/visitor present Nurse Communication: Mobility status;Precautions;Other (comment) (progression toward d/c) PT Visit Diagnosis: Unsteadiness on feet (R26.81);Other abnormalities of gait and mobility (R26.89);Muscle weakness (generalized) (M62.81);Difficulty in walking, not elsewhere classified (R26.2);Pain Pain -  Right/Left: Right Pain - part of body: Hip    Time: 1312-1406 PT Time Calculation (min) (ACUTE ONLY): 54 min   Charges:   PT Evaluation $PT Eval Low Complexity: 1 Low PT Treatments $Gait Training: 8-22 mins $Therapeutic Exercise: 8-22 mins $Therapeutic Activity: 8-22 mins        Baird Lyons, PT   Adair Patter 12/31/2022, 2:15 PM

## 2022-12-31 NOTE — Anesthesia Postprocedure Evaluation (Signed)
Anesthesia Post Note  Patient: Mariah Baker  Procedure(s) Performed: TOTAL HIP ARTHROPLASTY (Right: Hip)     Patient location during evaluation: PACU Anesthesia Type: Spinal Level of consciousness: oriented and awake and alert Pain management: pain level controlled Vital Signs Assessment: post-procedure vital signs reviewed and stable Respiratory status: spontaneous breathing and respiratory function stable Cardiovascular status: blood pressure returned to baseline and stable Postop Assessment: no headache, no backache, no apparent nausea or vomiting, spinal receding and patient able to bend at knees Anesthetic complications: no  No notable events documented.  Last Vitals:  Vitals:   12/31/22 1030 12/31/22 1045  BP: 117/68 108/60  Pulse: (!) 56 (!) 57  Resp: 14   Temp: 36.5 C   SpO2: 95% 92%    Last Pain:  Vitals:   12/31/22 1045  TempSrc:   PainSc: 5                  Darrion Macaulay,W. EDMOND

## 2022-12-31 NOTE — Discharge Instructions (Signed)

## 2022-12-31 NOTE — Interval H&P Note (Signed)
The patient has been re-examined, and the chart reviewed, and there have been no interval changes to the documented history and physical.    Plan for R THA for R hip OA  The operative side was examined and the patient was confirmed to have sensation to DPN, SPN, TN intact, Motor EHL, ext, flex 5/5, and DP 2+, PT 2+, No significant edema.   The risks, benefits, and alternatives have been discussed at length with patient, and the patient is willing to proceed.  Right hip marked. Consent has been signed.

## 2022-12-31 NOTE — Transfer of Care (Signed)
Immediate Anesthesia Transfer of Care Note  Patient: Mariah Baker  Procedure(s) Performed: TOTAL HIP ARTHROPLASTY (Right: Hip)  Patient Location: PACU  Anesthesia Type:Spinal  Level of Consciousness: awake, alert , and oriented  Airway & Oxygen Therapy: Patient Spontanous Breathing and Patient connected to face mask oxygen  Post-op Assessment: Report given to RN and Post -op Vital signs reviewed and stable  Post vital signs: Reviewed and stable  Last Vitals:  Vitals Value Taken Time  BP    Temp    Pulse    Resp 14 12/31/22 0936  SpO2    Vitals shown include unvalidated device data.  Last Pain:  Vitals:   12/31/22 0545  TempSrc: Oral         Complications: No notable events documented.

## 2022-12-31 NOTE — Anesthesia Procedure Notes (Signed)
Spinal  Patient location during procedure: OR Start time: 12/31/2022 7:25 AM End time: 12/31/2022 7:29 AM Reason for block: surgical anesthesia Staffing Performed: anesthesiologist  Anesthesiologist: Roderic Palau, MD Performed by: Roderic Palau, MD Authorized by: Roderic Palau, MD   Preanesthetic Checklist Completed: patient identified, IV checked, risks and benefits discussed, surgical consent, monitors and equipment checked, pre-op evaluation and timeout performed Spinal Block Patient position: sitting Prep: DuraPrep Patient monitoring: cardiac monitor, continuous pulse ox and blood pressure Approach: midline Location: L3-4 Injection technique: single-shot Needle Needle type: Pencan  Needle gauge: 24 G Needle length: 9 cm Assessment Sensory level: T8 Events: CSF return Additional Notes Functioning IV was confirmed and monitors were applied. Sterile prep and drape, including hand hygiene and sterile gloves were used. The patient was positioned and the spine was prepped. The skin was anesthetized with lidocaine.  Free flow of clear CSF was obtained prior to injecting local anesthetic into the CSF.  The spinal needle aspirated freely following injection.  The needle was carefully withdrawn.  The patient tolerated the procedure well.

## 2022-12-31 NOTE — Op Note (Addendum)
12/31/2022  9:16 AM  PATIENT:  Mariah Baker   MRN: [id number]  PRE-OPERATIVE DIAGNOSIS: End-stage right hip osteoarthritis  POST-OPERATIVE DIAGNOSIS:  same  PROCEDURE:  Procedure(s): Right total hip arthroplasty, posterior approach  PREOPERATIVE INDICATIONS:    Mariah Baker is an 52 y.o. female who has a diagnosis of end-stage right hip osteoarthritis and elected for surgical management after failing conservative treatment.  The risks benefits and alternatives were discussed with the patient including but not limited to the risks of nonoperative treatment, versus surgical intervention including infection, bleeding, nerve injury, periprosthetic fracture, the need for revision surgery, dislocation, leg length discrepancy, blood clots, cardiopulmonary complications, morbidity, mortality, among others, and they were willing to proceed.     OPERATIVE REPORT     SURGEON:  Charlies Constable, MD    ASSISTANT: Izola Price, RNFA, (Present throughout the entire procedure,  necessary for completion of procedure in a timely manner, assisting with retraction, instrumentation, and closure)     ANESTHESIA: Spinal  ESTIMATED BLOOD LOSS: 0000000    COMPLICATIONS:  None.   COMPONENTS:  . [employer] Trident 2 acetabular shell 50 mm, cluster hole, 6.5 hex screws x 2, Accolade 2 with 127 degree neck angle size #5,36-2.5 mm ceramic head Implant Name Type Inv. Item Serial No. Manufacturer Lot No. LRB No. Used Action  SHELL TRIDENT II CLUST 50 - [id number] Shell SHELL TRIDENT II CLUST 50  [employer] [id number] Right 1 Implanted  SCREW HEX LP 6.5X30 - [id number] Screw SCREW HEX LP 6.5X30  [employer] [id number] Right 1 Implanted  SCREW HEX LP 6.5X15 - [id number] Screw SCREW HEX LP 6.5X15  [employer] [id number] Right 1 Implanted  INSERT 0 DEGREE 36 - [id number] Miscellaneous INSERT 0 DEGREE 36  [employer] [id number] Right 1 Implanted  STEM HIP 127 DEG - [id number] Stem STEM HIP 127 DEG   [employer] [id number] A Right 1 Implanted  HIP BIOLOX HD 36/-2.5 - [id number] Joint HIP BIOLOX HD 36/-2.5  [employer] [id number] Right 1 Implanted      PROCEDURE IN DETAIL:   The patient was met in the holding area and  identified.  The appropriate hip was identified and marked at the operative site.  The patient was then transported to the OR  and  placed under anesthesia.  At that point, the patient was  placed in the lateral decubitus position with the operative side up and  secured to the operating room table  and all bony prominences padded. A subaxillary role was also placed.    The operative lower extremity was prepped from the iliac crest to the distal leg.  Sterile draping was performed.  Preoperative antibiotics, 3 gm of ancef,1 gm of Tranexamic Acid, and 8 mg of Decadron administered. Time out was performed prior to incision.      A routine posterolateral approach was utilized via sharp dissection  carried down to the subcutaneous tissue.  Gross bleeders were Bovie coagulated.  The iliotibial band was identified and incised along the length of the skin incision through the glute max fascia.  Charnley retractor was placed with care to protect the sciatic nerve posteriorly.  With the hip internally rotated, the piriformis tendon was identified and released from the femoral insertion and tagged with a #5 Ethibond.  A capsulotomy was then performed off the femoral insertion and also tagged with a #5 Ethibond.    The femoral neck was exposed, and I resected the femoral neck based on preoperative templating relative to the lesser trochanter.  I then exposed the deep acetabulum, cleared out any tissue including the ligamentum teres.  After adequate visualization, I excised the labrum.  I then started reaming with a 44 mm reamer, first medializing to the floor of the cotyloid fossa, and then in the position of the cup aiming towards the greater sciatic notch, matching the version of  the transverse acetabular ligament and tucked under the anterior wall. I reamed up to 50 mm reamer with good bony bed preparation and a 50 mm cup was chosen.  The real cup was then impacted into place.  Appropriate version and inclination was confirmed clinically matching their bony anatomy, and also with the use of the jig.  I placed 2 screws in the posterior superior quadrant to augment fixation.  A netural liner was placed and impacted. It was confirmed to be appropriately seated and the acetabular retractors were removed.    I then prepared the proximal femur using the box cutter, Charnley awl, and then sequentially broached starting with 0 up to a size 4.  A trial broach, neck, and head was utilized, and I reduced the hip and it was found to have excellent stability.  There was no impingement with full extension and 90 degrees external rotation.  The hip was stable at the position of sleep and with 90 degrees flexion and 80 degrees of internal rotation.  Leg lengths were also clinically assessed in the lateral position and felt to be equal. Intra-Op flatplate was obtained and confirmed appropriate component positions.  Good fill of the femur with the size 4 broach but felt there was still some room to go up to the 5.  And restoration of leg length and offset. No evidence or concern for fracture.  Worked at the 3, 4, and 5 broaches down to get the 5 seated to the same level.  Same neck as the size 4 broach.  A final femoral prosthesis size 5 was selected. I then impacted the real femoral prosthesis into place.I again trialed and selected a 36 -2.57m ball. The hip was then reduced and taken through a range of motion. There was no impingement with full extension and 90 degrees external rotation.  The hip was stable at the position of sleep and with 90 degrees flexion and 80 degrees of internal rotation. Leg lengths were  again assessed and felt to be restored.  We then opened, and I impacted the real head  ball into place.  The posterior capsule was then closed with #5 Ethibond.  The piriformis was repaired through the base of the abductor tendon using a [place] suture passer.  I then irrigated the hip copiously with irrisept irrigation solution and with normal saline pulse lavage. Periarticular injection was then performed with Exparel.   We repaired the fascia #1 barbed suture, followed by 0 barbed suture for the subcutaneous fat.  Skin was closed with 2-0 Vicryl and 3-0 Monocryl.  Dermabond and Aquacel dressing were applied. The patient was then awakened and returned to PACU in stable and satisfactory condition.  Leg lengths in the supine position were assessed and felt to be clinically equal. There were no complications.  Post op recs: WB: WBAT RLE, posterior hip precautions x 6 weeks given patient's size Abx: ancef Imaging: PACU pelvis Xray Dressing: Aquacell, keep intact until follow up DVT prophylaxis: Aspirin 81BID starting POD1 Follow up: 2 weeks after surgery for a wound check [facility] Dr[facility]  Address[address],  [place], [place] [address]  Office Phone: [phone]   Charlies Constable, MD Orthopedic Surgeon

## 2023-01-01 ENCOUNTER — Encounter (HOSPITAL_COMMUNITY): Payer: Self-pay | Admitting: Orthopedic Surgery

## 2023-07-15 ENCOUNTER — Other Ambulatory Visit: Payer: Self-pay | Admitting: Family Medicine

## 2023-07-15 DIAGNOSIS — Z1231 Encounter for screening mammogram for malignant neoplasm of breast: Secondary | ICD-10-CM

## 2023-08-01 ENCOUNTER — Ambulatory Visit
Admission: RE | Admit: 2023-08-01 | Discharge: 2023-08-01 | Disposition: A | Discharge status: Home / Self Care | Payer: 59 | Source: Ambulatory Visit | Attending: Family Medicine | Admitting: Family Medicine

## 2023-08-01 DIAGNOSIS — Z1231 Encounter for screening mammogram for malignant neoplasm of breast: Secondary | ICD-10-CM

## 2023-08-06 ENCOUNTER — Ambulatory Visit: Payer: 59 | Admitting: Obstetrics and Gynecology

## 2023-08-22 ENCOUNTER — Ambulatory Visit: Payer: 59 | Admitting: Obstetrics and Gynecology

## 2023-08-23 ENCOUNTER — Ambulatory Visit (INDEPENDENT_AMBULATORY_CARE_PROVIDER_SITE_OTHER): Payer: 59 | Admitting: Obstetrics and Gynecology

## 2023-08-23 ENCOUNTER — Encounter: Payer: Self-pay | Admitting: Obstetrics and Gynecology

## 2023-08-23 VITALS — BP 122/72 | Ht 64.0 in | Wt 217.0 lb

## 2023-08-23 DIAGNOSIS — E66812 Obesity, class 2: Secondary | ICD-10-CM

## 2023-08-23 DIAGNOSIS — E661 Drug-induced obesity: Secondary | ICD-10-CM

## 2023-08-23 DIAGNOSIS — Z3041 Encounter for surveillance of contraceptive pills: Secondary | ICD-10-CM | POA: Insufficient documentation

## 2023-08-23 DIAGNOSIS — Z01419 Encounter for gynecological examination (general) (routine) without abnormal findings: Secondary | ICD-10-CM | POA: Diagnosis not present

## 2023-08-23 DIAGNOSIS — E66813 Obesity, class 3: Secondary | ICD-10-CM | POA: Insufficient documentation

## 2023-08-23 MED ORDER — NORETHINDRONE 0.35 MG PO TABS: 1.0000 | ORAL_TABLET | Freq: Every day | ORAL | 3 refills | Status: DC

## 2023-08-23 NOTE — Progress Notes (Signed)
52 y.o. G1P1 female here for annual exam. Adopted,unknown family hx. Married. Daughter is 60 yo, IT consultant. Had recent right hip replacement. Doing well.  Patient's last menstrual period was 07/26/2023. Period Cycle (Days): 28 Period Duration (Days): 1-2 Period Pattern: Regular Menstrual Flow: Light Menstrual Control: Thin pad Dysmenorrhea: (!) Mild  Abnormal bleeding: none Pelvic discharge or pain: none Breast mass, nipple discharge or skin changes : none Birth control: POP Last PAP:     Component Value Date/Time   DIAGPAP  06/01/2021 0846    - Negative for intraepithelial lesion or malignancy (NILM)   ADEQPAP  06/01/2021 0846    Satisfactory for evaluation; transformation zone component ABSENT.   Last mammogram: 08/01/23 birads 1, density b Last colonoscopy: 2022 Sexually active: yes  Exercising: yes  GYN HISTORY: No significant hx  OB History  Gravida Para Term Preterm AB Living  1 1       1   SAB IAB Ectopic Multiple Live Births               # Outcome Date GA Lbr Len/2nd Weight Sex Type Anes PTL Lv  1 Para             Past Medical History:  Diagnosis Date   Arthritis    in hip   Complication of anesthesia    takes a little bit longer to wake up   Diabetes mellitus    Type 2   Headache    Hypertension    Hypothyroidism    "running high on my thyroid tests on low dose synthroid    Past Surgical History:  Procedure Laterality Date   ARTHOSCOPIC ROTAOR CUFF REPAIR Left 09/22/2018   Procedure: ARTHROSCOPIC ROTATOR CUFF REPAIR;  Surgeon: Bjorn Pippin, MD;  Location: [facility] OR;  Service: Orthopedics;  Laterality: Left;   BICEPT TENODESIS Left 09/22/2018   Procedure: BICEPS TENODESIS;  Surgeon: Bjorn Pippin, MD;  Location: [facility] OR;  Service: Orthopedics;  Laterality: Left;   COLONOSCOPY WITH PROPOFOL N/A 09/05/2021   Procedure: COLONOSCOPY WITH PROPOFOL;  Surgeon: Kathi Der, MD;  Location: [facility];  Service: Gastroenterology;  Laterality: N/A;   KNEE  ARTHROSCOPY     POLYPECTOMY  09/05/2021   Procedure: POLYPECTOMY;  Surgeon: Kathi Der, MD;  Location: [facility];  Service: Gastroenterology;;   SHOULDER ACROMIOPLASTY Left 09/22/2018   Procedure: SHOULDER ACROMIOPLASTY;  Surgeon: Bjorn Pippin, MD;  Location: [facility] OR;  Service: Orthopedics;  Laterality: Left;   SHOULDER ARTHROSCOPY Left 09/22/2018   Procedure: ARTHROSCOPY SHOULDER WITH CLAVICALECTOMY;  Surgeon: Bjorn Pippin, MD;  Location: [facility] OR;  Service: Orthopedics;  Laterality: Left;   TONSILECTOMY, ADENOIDECTOMY, BILATERAL MYRINGOTOMY AND TUBES     TOTAL HIP ARTHROPLASTY Right 12/31/2022   Procedure: TOTAL HIP ARTHROPLASTY;  Surgeon: Joen Laura, MD;  Location: [facility];  Service: Orthopedics;  Laterality: Right;    Current Outpatient Medications on File Prior to Visit  Medication Sig Dispense Refill   cholecalciferol (VITAMIN D3) 25 MCG (1000 UNIT) tablet Take 1,000 Units by mouth daily.     Coenzyme Q10 (COQ10) 100 MG CAPS Take 100 mg by mouth daily.     diclofenac (VOLTAREN) 75 MG EC tablet Take 75 mg by mouth 2 (two) times daily.     ezetimibe (ZETIA) 10 MG tablet Take 10 mg by mouth daily.     levothyroxine (SYNTHROID, LEVOTHROID) 50 MCG tablet Take 50 mcg by mouth daily before breakfast.     loratadine (CLARITIN) 10 MG tablet  Take 10 mg by mouth daily.     Menaquinone-7 (K2 PO) Take 1 tablet by mouth daily.     MOUNJARO 10 MG/0.5ML Pen SMARTSIG:10 Milligram(s) SUB-Q Once a Week     MOUNJARO 7.5 MG/0.5ML Pen      olmesartan-hydrochlorothiazide (BENICAR HCT) 40-25 MG tablet Take 1 tablet by mouth daily.     Omega-3 Fatty Acids (FISH OIL) 1200 MG CAPS Take 1,200 mg by mouth daily.     QUERCETIN PO Take 1 capsule by mouth daily.     topiramate (TOPAMAX) 100 MG tablet Take 50 mg by mouth at bedtime.      Zinc 50 MG CAPS Take 50 mg by mouth daily.     No current facility-administered medications on file prior to visit.    Social History   Socioeconomic History    Marital status: Married    Spouse name: Not on file   Number of children: Not on file   Years of education: Not on file   Highest education level: Not on file  Occupational History   Not on file  Tobacco Use   Smoking status: Former    Current packs/day: 0.00    Types: Cigarettes    Quit date: 12/12/2003    Years since quitting: 19.7   Smokeless tobacco: Never  Vaping Use   Vaping status: Never Used  Substance and Sexual Activity   Alcohol use: Not Currently   Drug use: No   Sexual activity: Yes    Partners: Male    Birth control/protection: Pill    Comment: 1st intercourse- 17, partners- 10, married- 16 yrs  Other Topics Concern   Not on file  Social History Narrative   Not on file   Social Determinants of Health   Financial Resource Strain: Not on file  Food Insecurity: Not on file  Transportation Needs: Not on file  Physical Activity: Not on file  Stress: Not on file  Social Connections: Not on file  Intimate Partner Violence: Not on file    Family History  Adopted: Yes  Family history unknown: Yes    No Known Allergies    PE Today's Vitals   08/23/23 1119  BP: 122/72  Weight: 217 lb (98.4 kg)  Height: 5\' 4"  (1.626 m)   Body mass index is 37.25 kg/m.  Physical Exam Vitals reviewed. Exam conducted with a chaperone present.  Constitutional:      General: She is not in acute distress.    Appearance: Normal appearance.  HENT:     Head: Normocephalic and atraumatic.     Nose: Nose normal.  Eyes:     Extraocular Movements: Extraocular movements intact.     Conjunctiva/sclera: Conjunctivae normal.  Neck:     Thyroid: No thyroid mass, thyromegaly or thyroid tenderness.  Pulmonary:     Effort: Pulmonary effort is normal.  Chest:     Chest wall: No mass or tenderness.  Breasts:    Right: Normal. No swelling, mass, nipple discharge, skin change or tenderness.     Left: Normal. No swelling, mass, nipple discharge, skin change or tenderness.   Abdominal:     General: There is no distension.     Palpations: Abdomen is soft.     Tenderness: There is no abdominal tenderness.  Genitourinary:    General: Normal vulva.     Exam position: Lithotomy position.     Urethra: No prolapse.     Vagina: Normal. No vaginal discharge or bleeding.     Cervix:  Normal. No lesion.     Uterus: Normal. Not enlarged and not tender.      Adnexa: Right adnexa normal and left adnexa normal.  Musculoskeletal:        General: Normal range of motion.     Cervical back: Normal range of motion.  Lymphadenopathy:     Upper Body:     Right upper body: No axillary adenopathy.     Left upper body: No axillary adenopathy.     Lower Body: No right inguinal adenopathy. No left inguinal adenopathy.  Skin:    General: Skin is warm and dry.  Neurological:     General: No focal deficit present.     Mental Status: She is alert.  Psychiatric:        Mood and Affect: Mood normal.        Behavior: Behavior normal.       Assessment and Plan:        Well woman exam with routine gynecological exam Assessment & Plan: Cervical cancer screening performed according to ASCCP guidelines. Encouraged annual mammogram screening Colonoscopy UTD Labs and immunizations with her primary Encouraged safe sexual practices as indicated Encouraged healthy lifestyle practices with diet and exercise For patients under 50-70yo, I recommend 1200mg  calcium daily and 600IU of vitamin D daily.    Class 2 drug-induced obesity with serious comorbidity and body mass index (BMI) of 37.0 to 37.9 in adult Assessment & Plan: Congratulated weight loss! Encouraged continued healthy diet and exercise.   Oral contraceptive pill surveillance -     Norethindrone; Take 1 tablet (0.35 mg total) by mouth daily.  Dispense: 84 tablet; Refill: 3    Rosalyn Gess, MD

## 2023-08-23 NOTE — Assessment & Plan Note (Signed)
Congratulated weight loss! Encouraged continued healthy diet and exercise.

## 2023-08-23 NOTE — Patient Instructions (Signed)

## 2023-08-23 NOTE — Assessment & Plan Note (Signed)
Cervical cancer screening performed according to ASCCP guidelines. Encouraged annual mammogram screening Colonoscopy UTD Labs and immunizations with her primary Encouraged safe sexual practices as indicated Encouraged healthy lifestyle practices with diet and exercise For patients under 50-51yo, I recommend 1200mg  calcium daily and 600IU of vitamin D daily.

## 2024-07-16 ENCOUNTER — Other Ambulatory Visit: Payer: Self-pay | Admitting: Obstetrics and Gynecology

## 2024-07-16 DIAGNOSIS — Z0000 Encounter for general adult medical examination without abnormal findings: Secondary | ICD-10-CM

## 2024-07-27 ENCOUNTER — Other Ambulatory Visit: Payer: Self-pay | Admitting: Medical Genetics

## 2024-07-29 ENCOUNTER — Encounter

## 2024-07-29 DIAGNOSIS — Z1231 Encounter for screening mammogram for malignant neoplasm of breast: Secondary | ICD-10-CM

## 2024-08-03 ENCOUNTER — Ambulatory Visit
Admission: RE | Admit: 2024-08-03 | Discharge: 2024-08-03 | Disposition: A | Source: Ambulatory Visit | Attending: Obstetrics and Gynecology

## 2024-08-03 DIAGNOSIS — Z0000 Encounter for general adult medical examination without abnormal findings: Secondary | ICD-10-CM

## 2024-08-05 ENCOUNTER — Ambulatory Visit: Payer: Self-pay | Admitting: Obstetrics and Gynecology

## 2024-08-14 ENCOUNTER — Other Ambulatory Visit (HOSPITAL_COMMUNITY)
Admission: RE | Admit: 2024-08-14 | Discharge: 2024-08-14 | Disposition: A | Discharge status: Home / Self Care | Payer: Self-pay | Source: Ambulatory Visit | Attending: Medical Genetics | Admitting: Medical Genetics

## 2024-08-24 LAB — GENECONNECT MOLECULAR SCREEN: Genetic Analysis Overall Interpretation: NEGATIVE

## 2024-08-26 ENCOUNTER — Ambulatory Visit (INDEPENDENT_AMBULATORY_CARE_PROVIDER_SITE_OTHER): Admitting: Obstetrics and Gynecology

## 2024-08-26 ENCOUNTER — Other Ambulatory Visit (HOSPITAL_COMMUNITY)
Admission: RE | Admit: 2024-08-26 | Discharge: 2024-08-26 | Disposition: A | Discharge status: Home / Self Care | Source: Ambulatory Visit | Attending: Obstetrics and Gynecology | Admitting: Obstetrics and Gynecology

## 2024-08-26 ENCOUNTER — Encounter: Payer: Self-pay | Admitting: Obstetrics and Gynecology

## 2024-08-26 VITALS — BP 100/72 | HR 75 | Temp 97.8°F | Ht 64.5 in | Wt 225.0 lb

## 2024-08-26 DIAGNOSIS — Z1331 Encounter for screening for depression: Secondary | ICD-10-CM | POA: Diagnosis not present

## 2024-08-26 DIAGNOSIS — Z124 Encounter for screening for malignant neoplasm of cervix: Secondary | ICD-10-CM | POA: Insufficient documentation

## 2024-08-26 DIAGNOSIS — Z01419 Encounter for gynecological examination (general) (routine) without abnormal findings: Secondary | ICD-10-CM | POA: Diagnosis not present

## 2024-08-26 DIAGNOSIS — Z3041 Encounter for surveillance of contraceptive pills: Secondary | ICD-10-CM

## 2024-08-26 MED ORDER — NORETHINDRONE 0.35 MG PO TABS: 1.0000 | ORAL_TABLET | Freq: Every day | ORAL | 3 refills | Status: AC

## 2024-08-26 NOTE — Progress Notes (Signed)
 53 y.o. G21P1001 female on POP here for annual exam. Adopted,unknown family hx. Married. Daughter is 44 yo, IT consultant, [employer].  PCP: Marvine Rush, MD  Had recent right hip replacement in 2024. On GLP1 for 2 years with ~50lb weight loss. Attempted to stop metformin, however she noticed leg swelling and skipped her cycle in October.  She was recently restarted on this by her PCP on a lower dose. Reports history of PCOS  Patient's last menstrual period was 07/10/2024 (exact date). Period Duration (Days): 3-4 Period Pattern: Regular Menstrual Flow: Light Menstrual Control: Maxi pad Dysmenorrhea: (!) Mild  Abnormal bleeding: none Pelvic discharge or pain: none Breast mass, nipple discharge or skin changes : none Birth control: POP Last PAP:     Component Value Date/Time   DIAGPAP  06/01/2021 0846    - Negative for intraepithelial lesion or malignancy (NILM)   ADEQPAP  06/01/2021 0846    Satisfactory for evaluation; transformation zone component ABSENT.   Last mammogram:08/03/24 birads 1, density b  Last colonoscopy: 2022, q99yr Sexually active: yes  Exercising: Not currently due to daughter's schedule  Flowsheet Row Office Visit [facility] 08/26/2024 [facility]  PHQ-2 Total Score 0    GYN HISTORY: History of PCOS  OB History  Gravida Para Term Preterm AB Living  1 1 1   1   SAB IAB Ectopic Multiple Live Births      1    # Outcome Date GA Lbr Len/2nd Weight Sex Type Anes PTL Lv  1 Term      CS-Unspec   LIV    Past Medical History:  Diagnosis Date  . Arthritis    in hip  . Complication of anesthesia    takes a little bit longer to wake up  . Diabetes mellitus    Type 2  . Headache   . Hypertension   . Hypothyroidism    running high on my thyroid  tests on low dose synthroid    Past Surgical History:  Procedure Laterality Date  . ARTHOSCOPIC ROTAOR CUFF REPAIR Left 09/22/2018   Procedure: ARTHROSCOPIC ROTATOR CUFF REPAIR;   Surgeon: Cristy Bonner DASEN, MD;  Location: [facility] OR;  Service: Orthopedics;  Laterality: Left;  . BICEPT TENODESIS Left 09/22/2018   Procedure: BICEPS TENODESIS;  Surgeon: Cristy Bonner DASEN, MD;  Location: [facility] OR;  Service: Orthopedics;  Laterality: Left;  . COLONOSCOPY WITH PROPOFOL  N/A 09/05/2021   Procedure: COLONOSCOPY WITH PROPOFOL ;  Surgeon: Elicia Claw, MD;  Location: [facility];  Service: Gastroenterology;  Laterality: N/A;  . KNEE ARTHROSCOPY    . POLYPECTOMY  09/05/2021   Procedure: POLYPECTOMY;  Surgeon: Elicia Claw, MD;  Location: [facility];  Service: Gastroenterology;;  . SHOULDER ACROMIOPLASTY Left 09/22/2018   Procedure: SHOULDER ACROMIOPLASTY;  Surgeon: Cristy Bonner DASEN, MD;  Location: [facility] OR;  Service: Orthopedics;  Laterality: Left;  . SHOULDER ARTHROSCOPY Left 09/22/2018   Procedure: ARTHROSCOPY SHOULDER WITH CLAVICALECTOMY;  Surgeon: Cristy Bonner DASEN, MD;  Location: [facility] OR;  Service: Orthopedics;  Laterality: Left;  . TONSILECTOMY, ADENOIDECTOMY, BILATERAL MYRINGOTOMY AND TUBES    . TOTAL HIP ARTHROPLASTY Right 12/31/2022   Procedure: TOTAL HIP ARTHROPLASTY;  Surgeon: Edna Toribio LABOR, MD;  Location: [facility];  Service: Orthopedics;  Laterality: Right;    Current Outpatient Medications on File Prior to Visit  Medication Sig Dispense Refill  . cholecalciferol (VITAMIN D3) 25 MCG (1000 UNIT) tablet Take 1,000 Units by mouth daily.    . Coenzyme Q10 (COQ10) 100 MG  CAPS Take 100 mg by mouth daily.    . diclofenac  (VOLTAREN ) 75 MG EC tablet Take 75 mg by mouth 2 (two) times daily.    SABRA ezetimibe (ZETIA) 10 MG tablet Take 10 mg by mouth daily.    SABRA levothyroxine (SYNTHROID, LEVOTHROID) 50 MCG tablet Take 50 mcg by mouth daily before breakfast.    . loratadine (CLARITIN) 10 MG tablet Take 10 mg by mouth daily.    SABRA losartan (COZAAR) 25 MG tablet Take 25 mg by mouth daily.    . Menaquinone-7 (K2 PO) Take 1 tablet by mouth daily.    . metFORMIN (GLUCOPHAGE) 500 MG tablet Take 500 mg  by mouth 2 (two) times daily.    SABRA MOUNJARO 12.5 MG/0.5ML Pen SMARTSIG:12.5 Milligram(s) SUB-Q Once a Week    . olmesartan-hydrochlorothiazide (BENICAR HCT) 40-25 MG tablet Take 1 tablet by mouth daily.    . Omega-3 Fatty Acids (FISH OIL) 1200 MG CAPS Take 1,200 mg by mouth daily.    SABRA QUERCETIN PO Take 1 capsule by mouth daily.    SABRA topiramate (TOPAMAX) 100 MG tablet Take 50 mg by mouth at bedtime.     . Zinc 50 MG CAPS Take 50 mg by mouth daily.     No current facility-administered medications on file prior to visit.    Social History   Socioeconomic History  . Marital status: Married    Spouse name: Not on file  . Number of children: Not on file  . Years of education: Not on file  . Highest education level: Not on file  Occupational History  . Not on file  Tobacco Use  . Smoking status: Former    Current packs/day: 0.00    Types: Cigarettes    Quit date: 12/12/2003    Years since quitting: 20.7  . Smokeless tobacco: Never  Vaping Use  . Vaping status: Never Used  Substance and Sexual Activity  . Alcohol  use: Not Currently  . Drug use: No  . Sexual activity: Yes    Partners: Male    Birth control/protection: Pill    Comment: 1st intercourse- 89, partners- 10, married- 16 yrs  Other Topics Concern  . Not on file  Social History Narrative  . Not on file   Social Drivers of Health   Financial Resource Strain: Not on file  Food Insecurity: Not on file  Transportation Needs: Not on file  Physical Activity: Not on file  Stress: Not on file  Social Connections: Not on file  Intimate Partner Violence: Not on file    Family History  Adopted: Yes  Family history unknown: Yes    No Known Allergies    PE Today's Vitals   08/26/24 0818  BP: 100/72  Pulse: 75  Temp: 97.8 F (36.6 C)  TempSrc: Oral  SpO2: 99%  Weight: 225 lb (102.1 kg)  Height: 5' 4.5 (1.638 m)    Body mass index is 38.02 kg/m.  Physical Exam Vitals reviewed. Exam conducted with a  chaperone present.  Constitutional:      General: She is not in acute distress.    Appearance: Normal appearance.  HENT:     Head: Normocephalic and atraumatic.     Nose: Nose normal.  Eyes:     Extraocular Movements: Extraocular movements intact.     Conjunctiva/sclera: Conjunctivae normal.  Neck:     Thyroid : No thyroid  mass, thyromegaly or thyroid  tenderness.  Pulmonary:     Effort: Pulmonary effort is normal.  Chest:  Chest wall: No mass or tenderness.  Breasts:    Right: Normal. No swelling, mass, nipple discharge, skin change or tenderness.     Left: Normal. No swelling, mass, nipple discharge, skin change or tenderness.  Abdominal:     General: There is no distension.     Palpations: Abdomen is soft.     Tenderness: There is no abdominal tenderness.   Genitourinary:    General: Normal vulva.     Exam position: Lithotomy position.     Urethra: No prolapse.     Vagina: Normal. No vaginal discharge or bleeding.     Cervix: Normal. No lesion.     Uterus: Normal. Not enlarged and not tender.      Adnexa: Right adnexa normal and left adnexa normal.  Musculoskeletal:        General: Normal range of motion.     Cervical back: Normal range of motion.  Lymphadenopathy:     Upper Body:     Right upper body: No axillary adenopathy.     Left upper body: No axillary adenopathy.     Lower Body: No right inguinal adenopathy. No left inguinal adenopathy.  Skin:    General: Skin is warm and dry.  Neurological:     General: No focal deficit present.     Mental Status: She is alert.  Psychiatric:        Mood and Affect: Mood normal.        Behavior: Behavior normal.      Assessment and Plan:        Well woman exam with routine gynecological exam Assessment & Plan: Cervical cancer screening performed according to ASCCP guidelines. Encouraged annual mammogram screening Colonoscopy UTD Labs and immunizations with her primary Encouraged safe sexual practices as  indicated Encouraged healthy lifestyle practices with diet and exercise For patients under 50-70yo, I recommend 1200mg  calcium daily and 600IU of vitamin D daily.    Cervical cancer screening -     Cytology - PAP  Screening for depression  Oral contraceptive pill surveillance -     Norethindrone ; Take 1 tablet (0.35 mg total) by mouth daily.  Dispense: 84 tablet; Refill: 3   Vera LULLA Pa, MD

## 2024-08-26 NOTE — Patient Instructions (Signed)

## 2024-08-26 NOTE — Assessment & Plan Note (Signed)
 Cervical cancer screening performed according to ASCCP guidelines. Encouraged annual mammogram screening Colonoscopy UTD Labs and immunizations with her primary Encouraged safe sexual practices as indicated Encouraged healthy lifestyle practices with diet and exercise For patients under 50-52yo, I recommend 1200mg  calcium daily and 600IU of vitamin D daily.

## 2024-08-28 LAB — CYTOLOGY - PAP
Comment: NEGATIVE
Diagnosis: NEGATIVE
High risk HPV: NEGATIVE

## 2024-09-01 ENCOUNTER — Ambulatory Visit: Payer: Self-pay | Admitting: Obstetrics and Gynecology
